# Patient Record
Sex: Female | Born: 1940
Health system: Southern US, Community
[De-identification: ages and names within clinical notes are randomized; demographics above are authoritative.]

## PROBLEM LIST (undated history)

## (undated) DIAGNOSIS — Z86010 Personal history of colonic polyps: Secondary | ICD-10-CM

## (undated) DIAGNOSIS — U071 COVID-19: Secondary | ICD-10-CM

## (undated) DIAGNOSIS — K317 Polyp of stomach and duodenum: Secondary | ICD-10-CM

## (undated) DIAGNOSIS — F419 Anxiety disorder, unspecified: Secondary | ICD-10-CM

## (undated) DIAGNOSIS — E785 Hyperlipidemia, unspecified: Secondary | ICD-10-CM

## (undated) DIAGNOSIS — I10 Essential (primary) hypertension: Secondary | ICD-10-CM

## (undated) DIAGNOSIS — M1990 Unspecified osteoarthritis, unspecified site: Secondary | ICD-10-CM

## (undated) DIAGNOSIS — K219 Gastro-esophageal reflux disease without esophagitis: Secondary | ICD-10-CM

## (undated) DIAGNOSIS — T7840XA Allergy, unspecified, initial encounter: Secondary | ICD-10-CM

## (undated) HISTORY — PX: CHOLECYSTECTOMY: SHX55

## (undated) HISTORY — DX: Unspecified osteoarthritis, unspecified site: M19.90

## (undated) HISTORY — PX: UPPER GASTROINTESTINAL ENDOSCOPY: SHX188

## (undated) HISTORY — PX: COLONOSCOPY: SHX174

## (undated) HISTORY — DX: Allergy, unspecified, initial encounter: T78.40XA

## (undated) HISTORY — DX: Personal history of colonic polyps: Z86.010

## (undated) HISTORY — DX: Polyp of stomach and duodenum: K31.7

## (undated) HISTORY — DX: Essential (primary) hypertension: I10

## (undated) HISTORY — DX: Hyperlipidemia, unspecified: E78.5

## (undated) HISTORY — PX: TOTAL ABDOMINAL HYSTERECTOMY: SHX209

## (undated) HISTORY — DX: COVID-19: U07.1

## (undated) HISTORY — DX: Gastro-esophageal reflux disease without esophagitis: K21.9

## (undated) HISTORY — DX: Anxiety disorder, unspecified: F41.9

---

## 2000-10-04 ENCOUNTER — Ambulatory Visit (HOSPITAL_COMMUNITY): Admission: RE | Admit: 2000-10-04 | Discharge: 2000-10-04 | Payer: Self-pay | Admitting: Gastroenterology

## 2004-01-07 ENCOUNTER — Ambulatory Visit: Payer: Self-pay | Admitting: Family Medicine

## 2004-01-10 ENCOUNTER — Ambulatory Visit: Payer: Self-pay | Admitting: Internal Medicine

## 2004-01-22 ENCOUNTER — Ambulatory Visit: Payer: Self-pay | Admitting: Internal Medicine

## 2004-02-06 ENCOUNTER — Ambulatory Visit: Payer: Self-pay | Admitting: Family Medicine

## 2004-04-15 ENCOUNTER — Ambulatory Visit: Payer: Self-pay | Admitting: Family Medicine

## 2004-08-04 ENCOUNTER — Ambulatory Visit: Payer: Self-pay | Admitting: Family Medicine

## 2004-10-07 ENCOUNTER — Ambulatory Visit: Payer: Self-pay | Admitting: Family Medicine

## 2005-01-14 ENCOUNTER — Ambulatory Visit: Payer: Self-pay | Admitting: Family Medicine

## 2005-05-18 ENCOUNTER — Ambulatory Visit: Payer: Self-pay | Admitting: Family Medicine

## 2005-10-07 ENCOUNTER — Ambulatory Visit: Payer: Self-pay | Admitting: Family Medicine

## 2005-10-27 ENCOUNTER — Ambulatory Visit: Payer: Self-pay | Admitting: Family Medicine

## 2005-11-02 ENCOUNTER — Ambulatory Visit: Payer: Self-pay | Admitting: Internal Medicine

## 2005-11-17 ENCOUNTER — Encounter (INDEPENDENT_AMBULATORY_CARE_PROVIDER_SITE_OTHER): Payer: Self-pay | Admitting: *Deleted

## 2005-11-17 ENCOUNTER — Ambulatory Visit: Payer: Self-pay | Admitting: Internal Medicine

## 2006-02-10 ENCOUNTER — Ambulatory Visit: Payer: Self-pay | Admitting: Family Medicine

## 2006-06-20 ENCOUNTER — Ambulatory Visit: Payer: Self-pay | Admitting: Family Medicine

## 2006-10-08 ENCOUNTER — Encounter: Admission: RE | Admit: 2006-10-08 | Discharge: 2006-10-08 | Payer: Self-pay | Admitting: Neurology

## 2008-12-13 ENCOUNTER — Encounter: Admission: RE | Admit: 2008-12-13 | Discharge: 2009-02-27 | Payer: Self-pay | Admitting: Orthopaedic Surgery

## 2009-05-25 ENCOUNTER — Inpatient Hospital Stay (HOSPITAL_COMMUNITY): Admission: EM | Admit: 2009-05-25 | Discharge: 2009-05-26 | Payer: Self-pay | Admitting: Emergency Medicine

## 2009-05-26 ENCOUNTER — Encounter (INDEPENDENT_AMBULATORY_CARE_PROVIDER_SITE_OTHER): Payer: Self-pay | Admitting: *Deleted

## 2009-05-26 ENCOUNTER — Encounter (INDEPENDENT_AMBULATORY_CARE_PROVIDER_SITE_OTHER): Payer: Self-pay | Admitting: Internal Medicine

## 2009-05-26 ENCOUNTER — Ambulatory Visit: Payer: Self-pay | Admitting: Vascular Surgery

## 2009-11-20 ENCOUNTER — Encounter: Payer: Self-pay | Admitting: Internal Medicine

## 2009-11-21 ENCOUNTER — Encounter (INDEPENDENT_AMBULATORY_CARE_PROVIDER_SITE_OTHER): Payer: Self-pay | Admitting: *Deleted

## 2010-01-12 ENCOUNTER — Ambulatory Visit: Payer: Self-pay | Admitting: Internal Medicine

## 2010-01-12 ENCOUNTER — Encounter (INDEPENDENT_AMBULATORY_CARE_PROVIDER_SITE_OTHER): Payer: Self-pay | Admitting: *Deleted

## 2010-01-12 DIAGNOSIS — K219 Gastro-esophageal reflux disease without esophagitis: Secondary | ICD-10-CM

## 2010-01-12 DIAGNOSIS — E669 Obesity, unspecified: Secondary | ICD-10-CM | POA: Insufficient documentation

## 2010-01-12 DIAGNOSIS — R1319 Other dysphagia: Secondary | ICD-10-CM

## 2010-01-13 ENCOUNTER — Ambulatory Visit: Payer: Self-pay | Admitting: Internal Medicine

## 2010-01-18 ENCOUNTER — Encounter: Payer: Self-pay | Admitting: Internal Medicine

## 2010-03-17 ENCOUNTER — Ambulatory Visit
Admission: RE | Admit: 2010-03-17 | Discharge: 2010-03-17 | Payer: Self-pay | Source: Home / Self Care | Attending: Internal Medicine | Admitting: Internal Medicine

## 2010-04-02 NOTE — Letter (Signed)
Summary: EGD Instructions  [facility]  [address] [place], [place] [address]   Phone: [phone]  Fax: [phone]       Grace Mccann    November 22, 1940    MRN: [id number]       Procedure Day Dorna Bloom: Jake Shark, 01/13/10     Arrival Time: 10:00 AM     Procedure Time: 11:00 AM    Location of Procedure:                    _X_ [facility] (4th Floor)  PREPARATION FOR ENDOSCOPY   On TUESDAY, 01/13/10, THE DAY OF THE PROCEDURE:  1.   No solid foods, milk or milk products are allowed after midnight the night before your procedure.  2.   Do not drink anything colored red or purple.  Avoid juices with pulp.  No orange juice.  3.  You may drink clear liquids until 9:00 AM, which is 2 hours before your procedure.                                                                                                CLEAR LIQUIDS INCLUDE: Water Jello Ice Popsicles Tea (sugar ok, no milk/cream) Powdered fruit flavored drinks Coffee (sugar ok, no milk/cream) Gatorade Juice: apple, white grape, white cranberry  Lemonade Clear bullion, consomm, broth Carbonated beverages (any kind) Strained chicken noodle soup Hard Candy   MEDICATION INSTRUCTIONS  Unless otherwise instructed, you should take regular prescription medications with a small sip of water as early as possible the morning of your procedure.                    OTHER INSTRUCTIONS  You will need a responsible adult at least 69 years of age to accompany you and drive you home.   This person must remain in the waiting room during your procedure.  Wear loose fitting clothing that is easily removed.  Leave jewelry and other valuables at home.  However, you may wish to bring a book to read or an iPod/MP3 player to listen to music as you wait for your procedure to start.  Remove all body piercing jewelry and leave at home.  Total time from sign-in until discharge is approximately 2-3  hours.  You should go home directly after your procedure and rest.  You can resume normal activities the day after your procedure.  The day of your procedure you should not:   Drive   Make legal decisions   Operate machinery   Drink alcohol   Return to work  You will receive specific instructions about eating, activities and medications before you leave.    The above instructions have been reviewed and explained to me by   _______________________    I fully understand and can verbalize these instructions _____________________________ Date _________

## 2010-04-02 NOTE — Letter (Signed)
Summary: New Patient letter  [facility]  [address] [place], [place] [address]   Phone: [phone]  Fax: [phone]       11/21/2009 MRN: [id number]  Grace Mccann [address] [place], [place]  [address]  Dear Grace Mccann,  Welcome to the Gastroenterology Division at [facility].    You are scheduled to see Dr. Leone Payor on 01/12/2010 at 1:45PM on the 3rd floor at [facility], [address]  We ask that you try to arrive at our office 15 minutes prior to your appointment time to allow for check-in.  We would like you to complete the enclosed self-administered evaluation form prior to your visit and bring it with you on the day of your appointment.  We will review it with you.  Also, please bring a complete list of all your medications or, if you prefer, bring the medication bottles and we will list them.  Please bring your insurance card so that we may make a copy of it.  If your insurance requires a referral to see a specialist, please bring your referral form from your primary care physician.  Co-payments are due at the time of your visit and may be paid by cash, check or credit card.     Your office visit will consist of a consult with your physician (includes a physical exam), any laboratory testing he/she may order, scheduling of any necessary diagnostic testing (e.g. x-ray, ultrasound, CT-scan), and scheduling of a procedure (e.g. Endoscopy, Colonoscopy) if required.  Please allow enough time on your schedule to allow for any/all of these possibilities.    If you cannot keep your appointment, please call [phone] to cancel or reschedule prior to your appointment date.  This allows [place] the opportunity to schedule an appointment for another patient in need of care.  If you do not cancel or reschedule by 5 p.m. the business day prior to your appointment date, you will be charged a $50.00 late cancellation/no-show fee.    Thank you for choosing [facility] for your medical needs.  We appreciate the opportunity to care for you.  Please visit [place] at our website  to learn more about our practice.                     Sincerely,                                                             The Gastroenterology Division

## 2010-04-02 NOTE — Procedures (Signed)
Summary: EGD:    EGD  Procedure date:  11/17/2005  Findings:      Comments: 1) DISTAL ESOPHAGEAL RINGS (DILATED TO 54 FR) 2) POLYPOID LESIONS IN CARDIA OF UNCLEAR SIGNIFICANCE, BIOPSY TAKEN 3) OTHERWISE OK  ***MICROSCOPIC EXAMINATION AND DIAGNOSIS***    STOMACH: CHRONIC GASTRITIS. NO HELICOBACTER PYLORI, INTESTINAL METAPLASIA, OR EVIDENCE OF MALIGNANCY IDENTIFIED.  Patient Name: Grace Mccann, Grace Mccann MRN: [id number] Procedure Procedures: Panendoscopy (EGD) CPT: 43235.    with biopsy(s)/brushing(s). CPT: D1846139.    with [facility] Dilation of Esophagus Personnel: Endoscopist: Iva Boop, MD, [facility].  Referred By: Joette Catching, MD.  Exam Location: Exam performed in Outpatient Clinic. Outpatient  Patient Consent: Procedure, Alternatives, Risks and Benefits discussed, consent obtained, from patient. Consent was obtained by the RN.  Indications  Therapeutics: Reason for exam: Esophageal dilation.  Symptoms: Dysphagia.  History  Current Medications: Patient is not currently taking Coumadin.  Allergies: No known allergies.  Pre-Exam Physical: Performed Nov 17, 2005  Cardio-pulmonary exam, HEENT exam, Abdominal exam, Mental status exam WNL.  Comments: Pt. history reviewed/updated, physical exam performed prior to initiation of sedation? yes Exam Exam Info: Maximum depth of insertion Duodenum, intended Duodenum. Patient position: on left side. Gastric retroflexion performed. Images taken. ASA Classification: II. Tolerance: excellent.  Sedation Meds: Patient assessed and found to be appropriate for moderate (conscious) sedation. Fentanyl 50 mcg. given IV. Versed 5 mg. given IV. Cetacaine Spray 2 sprays given aerosolized.  Monitoring: BP and pulse monitoring done. Oximetry used. Supplemental O2 given  Findings - Normal: Proximal Esophagus to Mid Esophagus.  STRICTURE / STENOSIS: Stricture in Distal Esophagus.  Constriction: partial. Comment: two rings in distal esophagus,  above z-line (which is at 37 cm).  - Dilation: Distal Esophagus. Maloney dilator used, Diameter: 54 F, Minimal Resistance, No Heme present on extraction. 1  total dilators used. Patient tolerance excellent.  - Normal: Fundus to Duodenal 2nd Portion.  POLYP: in Cardia. Maximum size: 6 mm. sessile polyp. Procedure: biopsy without cautery, Comments: 3 polypoid lesions in cardia.   Assessment  Comments: 1) DISTAL ESOPHAGEAL RINGS (DILATED TO 54 FR) 2) POLYPOID LESIONS IN CARDIA OF UNCLEAR SIGNIFICANCE, BIOPSY TAKEN 3) OTHERWISE OK Events  Unplanned Intervention: No unplanned interventions were required.  Plans Instructions: Clear or full liquids: clears until 1530 then soft. Resume previous diet: tomorrow.  Medication(s): Continue current medications.  Patient Education: Patient given standard instructions for: Stenosis / Stricture.  Disposition: After procedure patient sent to recovery. After recovery patient sent home.  Scheduling: Path Letter, to The Patient, re: results   Comments: Call for visit if dysphagia (abnormal swallowing) persists or returns  CC:   Joette Catching, MD  This report was created from the original endoscopy report, which was reviewed and signed by the above listed endoscopist.

## 2010-04-02 NOTE — Assessment & Plan Note (Signed)
Summary: 70M FU AFTER ENDO/YF   History of Present Illness Visit Type: Follow-up Visit Primary GI MD: Stan Head MD [facility] Primary Provider: Joette Catching, MD Requesting Provider: n/a Chief Complaint: Patient here for a 1 month f/u after endoscopy. She denies any current GI symptoms. History of Present Illness:   She is not having dysphagia at this time. She tried lansoprazle but for some reason went back to omeprazole 40 mg daily. She began Align at the direction of Dr. Lysbeth Galas and feels that has helped her significantly as well.    GI Review of Systems      Denies abdominal pain, acid reflux, belching, bloating, chest pain, dysphagia with liquids, dysphagia with solids, heartburn, loss of appetite, nausea, vomiting, vomiting blood, weight loss, and  weight gain.        Denies anal fissure, black tarry stools, change in bowel habit, constipation, diarrhea, diverticulosis, fecal incontinence, heme positive stool, hemorrhoids, irritable bowel syndrome, jaundice, light color stool, liver problems, rectal bleeding, and  rectal pain. Preventive Screening-Counseling & Management  Caffeine-Diet-Exercise     Does Patient Exercise: no    Current Medications (verified): 1)  Mobic 7.5 Mg Tabs (Meloxicam) .... Take 1 Tablet By Mouth Once A Day 2)  Premarin 0.3 Mg Tabs (Estrogens Conjugated) .... Take 1 Tablet By Mouth Once A Day 3)  Livalo 4 Mg Tabs (Pitavastatin Calcium) .... Take 1 Tablet By Mouth On Monday, Wednesday, and Friday 4)  Exforge 10-160 Mg Tabs (Amlodipine Besylate-Valsartan) .... Take 1 Tablet By Mouth Once A Day 5)  Lorazepam 0.5 Mg Tabs (Lorazepam) .Marland Kitchen.. 1 By Mouth Once Daily As Needed 6)  Aspirin 81 Mg Tbec (Aspirin) .Marland Kitchen.. 1 By Mouth Once Daily 7)  Omeprazole 40 Mg Cpdr (Omeprazole) .... Take 1 Tablet By Mouth Once A Day 8)  Flonase 50 Mcg/act Susp (Fluticasone Propionate) .... 2 Spays in Each Nostril Daily 9)  Align  Caps (Probiotic Product) .... Take 1 Capsule By Mouth  Once Daily  Allergies (verified): 1)  Niacin  Past History:  Past Medical History: Arthritis GERD/esophageal ring Hyperlipidemia Hypertension Hx of gallstones 70 Gastric polyps  Past Surgical History: Hysterectomy  70 Cholecystectomy  Family History: Family History of Breast Cancer:Mother No FH of Colon Cancer: Family History of Heart Disease: Father  Social History: Occupation: retired Married 3 children Patient has never smoked.  Illicit Drug Use - no Alcohol Use - no Patient does not get regular exercise.  Does Patient Exercise:  no  Vital Signs:  Patient profile:   70 year old female Height:      66 inches Weight:      208.38 pounds BMI:     33.75 BSA:     2.04 Pulse rate:   84 / minute Pulse rhythm:   regular BP sitting:   112 / 72  (left arm)  Vitals Entered By: Lamona Curl CMA Duncan Dull) (March 17, 2010 1:33 PM)  Physical Exam  General:  obese.  NAD   Impression & Recommendations:  Problem # 1:  OTHER DYSPHAGIA (ICD-787.29) Assessment Improved She responded to dilation with [facility] dilator.  Problem # 2:  GERD (ICD-530.81) Assessment: Improved Doing well on omperazole and will continue. Refills via PCP and see me as needed for this.  Problem # 3:  GASTRIC POLYPS (ICD-211.1) Assessment: New multiple hyperplastic polyps - typically not pre-cancerous but rarely so will perform surveillance EGD 11/12 (at least one to check for any changes or progression - removal of larger ones possibly)  Patient Instructions:  1)  Copy sent to : Joette Catching, MD 2)  Continue your current medications and seeing your PCP as needed. 3)  Please schedule a follow-up appointment as needed.  4)  The medication list was reviewed and reconciled.  All changed / newly prescribed medications were explained.  A complete medication list was provided to the patient / caregiver.

## 2010-04-02 NOTE — Letter (Signed)
[facility]   Imported By: Lester  01/19/2010 08:23:35  _____________________________________________________________________  External Attachment:    Type:   Image     Comment:   External Document

## 2010-04-02 NOTE — Miscellaneous (Signed)
  Clinical Lists Changes  Medications: Removed medication of PREVACID 15 MG CPDR (LANSOPRAZOLE) 1 by mouth once daily Changed medication from OMEPRAZOLE 40 MG CPDR (OMEPRAZOLE) 1 by mouth once daily to LANSOPRAZOLE 30 MG CPDR (LANSOPRAZOLE) 1 by mouth twice a day 30-60 minutes before breakfast and supper - Signed Rx of LANSOPRAZOLE 30 MG CPDR (LANSOPRAZOLE) 1 by mouth twice a day 30-60 minutes before breakfast and supper;  #60 x 2;  Signed;  Entered by: Iva Boop MD, Clementeen Graham;  Authorized by: Iva Boop MD, [facility];  Method used: Electronically to [facility] [address], [place], [place]  [address], Ph: [phone], Fax:    Prescriptions: LANSOPRAZOLE 30 MG CPDR (LANSOPRAZOLE) 1 by mouth twice a day 30-60 minutes before breakfast and supper  #60 x 2   Entered and Authorized by:   Iva Boop MD, [facility]   Signed by:   Iva Boop MD, [facility] on 01/13/2010   Method used:   Electronically to        [facility]* (retail)       [address]       [place], [place]  [address]       Ph: [phone]       Fax:    RxID:   [id number]

## 2010-04-02 NOTE — Discharge Summary (Signed)
Summary: Hospital Discharge Summary   DATE OF ADMISSION:  05/25/2009   DATE OF DISCHARGE:  05/26/2009                                  DISCHARGE SUMMARY      PRIMARY CARE PHYSICIAN:  Dr. Ralene Ok. Nyland.      DISCHARGE DIAGNOSES:   1. Syncope.  Complete evaluation unrevealing.   2. Hypertension.   3. Gastroesophageal reflux disease.   4. Arthritis.   5. Hyperlipidemia.   6. Diuretic associated hypokalemia, resolved.   7. Allergy to niacin.      DISCHARGE MEDICATIONS:  A complete list of the patient's discharge   medications is available as per the discharge medication manager portion   of the [facility] E-Chart computer system.  In short, the   patient is being discharged on all the same medications she was using   prior to her admission with the exception of Diovan/HCT which she has   been instructed to hold until further evaluation by her primary care   physician.      FOLLOWUP:  The patient is advised to refrain from driving for 2 weeks   because of the possibility for recurrent syncope.  She is advised to   follow up with her primary care physician within 5-7 days for   reevaluation.  At that time, a B-Met should be obtained to ensure that   the patient's potassium remains stable.  It was 3.6 at discharge.  The   patient's blood pressure should be assessed, and consideration should be   given to resuming Diovan with or without the hydrochlorothiazide   component.      CONSULTATIONS:  None.      PROCEDURES:   1. CT scan of the head May 25, 2009 was negative.   2. CT angiogram of the chest May 25, 2009 with atelectasis in both       lower lobes.  No embolus or dissection identified.   3. Bilateral carotid Dopplers May 26, 2009.  Right 40%-59% ICA       stenosis, within the low range.  No evidence of left ICA stenosis.       Bilateral vertebral artery flow was antegrade.   4. Right lower extremity venous Doppler May 18, 2009 with no DVT.   5.  Transthoracic echocardiogram May 26, 2009 shows LV cavity size       normal.  Ejection fraction was 55%-60%.  Systolic function normal.       No regional wall motion abnormalities.      HOSPITAL COURSE:  Ms. Grace Mccann is a very pleasant 70 year old   female who was admitted to the hospital on May 25, 2009 after she   suffered a simple syncopal spell at a family member's house.  She had   just eaten lunch and began to develop abdominal cramps.  She then began   to experience some nausea.  She went into the bathroom.  She apparently   sat on the toilet and attempted to defecate.  She then apparently fell   over onto the floor and was found by family.  Family reports the patient   was initially unresponsive and appeared to be clenching her jaw.  There   was some concern that she was experiencing difficulty breathing.  There   was no tonic clonic type behavior.  The patient rapidly aroused.  EMS  was consulted.  The patient was brought to the emergency room and   ultimately admitted to the acute unit.  She was monitored on telemetry   with no evidence of arrhythmia.  EKGs were unrevealing, and she ruled   out for acute coronary syndrome.  Because of a very borderline elevated   D-dimer, a CT angiogram of the chest was carried out.  This was   unrevealing with no evidence of pulmonary embolism or other lung   abnormalities.  Carotid Dopplers were accomplished and were   insignificant.  Transthoracic echocardiogram was accomplished and was   unremarkable.  Lower extremity DVT was ruled out with a venous Doppler   that was negative.  Laboratory evaluation revealed only a mild   hypokalemia.  This was felt to be secondary to hydrochlorothiazide use   at home.  This was quickly replaced with oral potassium.  The patient   was mildly dehydrated.  This was corrected with IV fluid resuscitation.   At this time, it is felt that the patient likely suffered a syncopal   spell due either to  Valsalva or simply a vasovagal event related to her   abdominal cramping and nausea.  The patient has been advised that   syncopal spells could recur.  She has been advised that she should   refrain from driving for 2 weeks until it can be proven that she will   not have frequent syncopal spells.  She is advised to hold her   Diovan/HCT until follow-up with her primary care physician.  She is   advised to follow up within 5-7 days for reassessment of her blood   pressure and potassium level.               Lonia Blood, M.D.            JTM/MEDQ  D:  05/26/2009  T:  05/26/2009  Job:  [id number]      cc:   Delaney Meigs, M.D.      Electronically Signed by Jetty Duhamel M.D. on 06/18/2009 07:24:13 PM

## 2010-04-02 NOTE — Assessment & Plan Note (Signed)
Summary: DYSPHAGIA...AS.   History of Present Illness Visit Type: Initial Consult Primary GI MD: Stan Head MD [facility] Primary Provider: Fanny Skates Requesting Provider: Fanny Skates Chief Complaint: ? Dyspahgia, Pt states she is not sure if its Dyspagia or Sinuses History of Present Illness:   70 yo ww with globus sensation and tightness in neck. She swallows and it still feels like something is there. Most of the time food passes but with certain foods its hard to go down (salmon, chicken, bread, crowder peas, rice). These problems have been occurring for a few months at least. she had a dilation for dysphagia in 2007 with benefit. She had been on omeprazole 40 mg a day for 6-9 months, started after Nexium stopped being efficacious - "I still had that in my throat". recentky added lansoprazole 15 mg daily and is taking with the omeprazole at 10-11 AM. She does have sinus drainage issues also.   GI Review of Systems    Reports acid reflux, dysphagia with solids, heartburn, and  nausea.      Denies abdominal pain, belching, bloating, chest pain, dysphagia with liquids, loss of appetite, vomiting, vomiting blood, weight loss, and  weight gain.        Denies anal fissure, black tarry stools, change in bowel habit, constipation, diarrhea, diverticulosis, fecal incontinence, heme positive stool, hemorrhoids, irritable bowel syndrome, jaundice, light color stool, liver problems, rectal bleeding, and  rectal pain. Preventive Screening-Counseling & Management  Alcohol-Tobacco     Smoking Status: never      Drug Use:  no.      Clinical Reports Reviewed:  EGD:  11/17/2005:  Comments: 1) DISTAL ESOPHAGEAL RINGS (DILATED TO 54 FR) 2) POLYPOID LESIONS IN CARDIA OF UNCLEAR SIGNIFICANCE, BIOPSY TAKEN 3) OTHERWISE OK  ***MICROSCOPIC EXAMINATION AND DIAGNOSIS***    STOMACH: CHRONIC GASTRITIS. NO HELICOBACTER PYLORI, INTESTINAL METAPLASIA, OR EVIDENCE OF MALIGNANCY  IDENTIFIED.  Colonoscopy:  01/22/2004:  Assessment Normal examination.  Comments: NO POLYPS OR CANCER SEEN   Current Medications (verified): 1)  Aspirin 81 Mg Tabs (Aspirin) .... Take 1 Tablet By Mouth Once A Day 2)  Mobic 7.5 Mg Tabs (Meloxicam) .... Take 1 Tablet By Mouth Once A Day 3)  Premarin 0.3 Mg Tabs (Estrogens Conjugated) .... Take 1 Tablet By Mouth Once A Day 4)  Livalo 4 Mg Tabs (Pitavastatin Calcium) .... Take 1/2 Tab By Mouth On Monday, Wednesday, and Friday 5)  Exforge 10-160 Mg Tabs (Amlodipine Besylate-Valsartan) 6)  Lorazepam 0.5 Mg Tabs (Lorazepam) .Marland Kitchen.. 1 By Mouth Once Daily As Needed 7)  Aspirin 81 Mg Tbec (Aspirin) .Marland Kitchen.. 1 By Mouth Once Daily 8)  Omeprazole 40 Mg Cpdr (Omeprazole) .Marland Kitchen.. 1 By Mouth Once Daily 9)  Flonase 50 Mcg/act Susp (Fluticasone Propionate) .... 2 Spays in Each Nostril Daily 10)  Prevacid 15 Mg Cpdr (Lansoprazole) .Marland Kitchen.. 1 By Mouth Once Daily  Allergies (verified): 1)  Niacin  Past History:  Past Medical History: Arthritis GERD/esophageal ring Hyperlipidemia Hypertension Hx of gallstones 89  Past Surgical History: Hysterectomy  58  Family History: Family History of Breast Cancer:Mother No FH of Colon Cancer:  Social History: Occupation: retired Married 3 children Patient has never smoked.  Illicit Drug Use - no Alcohol Use - no Smoking Status:  never Drug Use:  no  Review of Systems       The patient complains of allergy/sinus.         All other ROS negative except as per HPI.   Vital Signs:  Patient profile:  70 year old female Height:      66 inches Weight:      211 pounds BMI:     34.18 BSA:     2.05 Pulse rate:   80 / minute BP sitting:   110 / 80  (left arm)  Vitals Entered By: Merri Ray CMA Duncan Dull) (January 12, 2010 1:36 PM)  Physical Exam  General:  obese.  NAD Eyes:  PERRLA, no icterus. Mouth:  No deformity or lesions, dentition normal. Neck:  Supple; no masses or thyromegaly. Lungs:  Clear  throughout to auscultation. Heart:  Regular rate and rhythm; no murmurs, rubs,  or bruits. Abdomen:  Soft, nontender and nondistended. No masses, hepatosplenomegaly or hernias noted. Normal bowel sounds. Cervical Nodes:  No significant cervical or supraclavicular adenopathy.  Psych:  Alert and cooperative. Normal mood and affect.   Impression & Recommendations:  Problem # 1:  OTHER DYSPHAGIA (ICD-787.29) Assessment Deteriorated  Last evaluated 2007 Ring dilated then also with globus which could be GERD and/or sinus EGD/dili Risks, benefits,and indications of endoscopic procedure(s) were reviewed with the patient and all questions answered.  change to lansoprazole 30 mg daily (Nexium stoped working, omeprazole was not.) wait for EGD to Rx (having tomorrow)  Orders: EGD SAV (EGD SAV)  Problem # 2:  GERD (ICD-530.81) Assessment: Deteriorated  Last evaluated 2007 Ring dilated then also with globus which could be GERD and/or sinus EGD/dili Risks, benefits,and indications of endoscopic procedure(s) were reviewed with the patient and all questions answered.  change to lansoprazole 30 mg daily (Nexium stoped working, omeprazole was not.) wait for EGD to Rx (having tomorrow)  Orders: EGD SAV (EGD SAV)  Problem # 3:  OBESITY (ICD-278.00) Assessment: New She is losing weight, she says. Encouraged to continue to do so as will help GERD and overall health.  Patient Instructions: 1)  You need to keep trying to  lose weight. Start by limiting portions, amounts. Avoid eating when not hungry. Limit desserts.Look for high fructose corn syrup on food labels and if in first 3 ingredients, avoid that food. Also try to eat whole grains, avoid "white foods" (e.g. white rice, white bread).  Once your esophagus is working better. 2)  [facility] Patient Information Guide given to patient.  3)  Upper Endoscopy with Dilatation brochure given.  4)  Copy sent to : Joette Catching, MD 5)   The medication list was reviewed and reconciled.  All changed / newly prescribed medications were explained.  A complete medication list was provided to the patient / caregiver.

## 2010-04-02 NOTE — Procedures (Signed)
Summary: Colonoscopy:    Colonoscopy  Procedure date:  01/22/2004  Findings:      Assessment Normal examination.  Comments: NO POLYPS OR CANCER SEEN  Procedures Next Due Date:    Colonoscopy: 01/2014  Patient Name: Grace Mccann, Grace Mccann MRN: [id number] Procedure Procedures: Colonoscopy CPT: 19147.  Personnel: Endoscopist: Iva Boop, MD, [facility].  Referred By: Joette Catching, MD.  Exam Location: Exam performed in Outpatient Clinic. Outpatient  Patient Consent: Procedure, Alternatives, Risks and Benefits discussed, consent obtained, from patient. Consent was obtained by the RN.  Indications  Average Risk Screening Routine.  History  Current Medications: Patient is not currently taking Coumadin.  Pre-Exam Physical: Cardio-pulmonary exam, Rectal exam, HEENT exam , Mental status exam WNL. Abnormal PE findings include: ASA AIRWAY III.  Exam Exam: Extent of exam reached: Cecum, extent intended: Cecum.  The cecum was identified by appendiceal orifice and IC valve. Patient position: on left side. Colon retroflexion performed. Images taken. ASA Classification: II. Tolerance: good.  Monitoring: Pulse and BP monitoring, Oximetry used. Supplemental O2 given.  Colon Prep Used MiraLax for colon prep. Prep results: excellent.  Sedation Meds: Patient assessed and found to be appropriate for moderate (conscious) sedation. Fentanyl 75 mcg. given IV. Versed 7 mg. given IV.  Findings - NORMAL EXAM: Cecum to Rectum.   Assessment Normal examination.  Comments: NO POLYPS OR CANCER SEEN Events  Unplanned Interventions: No intervention was required.  Plans Patient Education: Patient given standard instructions for: a normal exam. Yearly hemoccult testing recommended. START IN 5 YRS.  Disposition: After procedure patient sent to recovery. After recovery patient sent home.  Scheduling/Referral: Primary Care Tonja Jezewski, to Joette Catching, MD, AS PLANNED,  Colonoscopy, to Iva Boop, MD, [facility], [address],   CC:   Joette Catching, MD  This report was created from the original endoscopy report, which was reviewed and signed by the above listed endoscopist.

## 2010-04-02 NOTE — Procedures (Signed)
Summary: Upper Endoscopy  Patient: [name] Note: All result statuses are Final unless otherwise noted.  Tests: (1) Upper Endoscopy (EGD)   EGD Upper Endoscopy       DONE     [facility]     [address]     [place], [place]  [address]           ENDOSCOPY PROCEDURE REPORT           PATIENT:  Grace Mccann, Grace Mccann  MR#:  [id number]     BIRTHDATE:  1941-02-03, 69 yrs. old  GENDER:  female           ENDOSCOPIST:  Iva Boop, MD, [facility]           PROCEDURE DATE:  01/13/2010     PROCEDURE:  EGD with biopsy, 43239, Elease Hashimoto Dilation of Esophagus     ASA CLASS:  Class II     INDICATIONS:  dysphagia, reflux symptoms despite therapy           MEDICATIONS:   Fentanyl 50 mcg IV, Versed 5 mg IV     TOPICAL ANESTHETIC:  Exactacain Spray           DESCRIPTION OF PROCEDURE:   After the risks benefits and     alternatives of the procedure were thoroughly explained, informed     consent was obtained.  The LB GIF-H180 [id number] endoscope was     introduced through the mouth and advanced to the second portion of     the duodenum, without limitations.  The instrument was slowly     withdrawn as the mucosa was fully examined.     <<PROCEDUREIMAGES>>           There were multiple polyps identified. in the body of the stomach.     Seen in fundus and cardia also. Numerous with maximum size 8-10     mm. representative biopsies taken. Most were fleshy and with     similar appearance as mucosa, some were erythematous (cardia).     Multiple biopsies were obtained and sent to pathology.  Otherwise     the examination was normal. Maloney dilation 59 French performed     after scope withdrawn.     Retroflexed views revealed Retroflexion     exam demonstrated findings as previously described.    The scope     was then withdrawn from the patient and the procedure completed.           COMPLICATIONS:  None           ENDOSCOPIC IMPRESSION:     1) Polyps, multiple in the body of the stomach -  biopsied     2) Otherwise normal examination     RECOMMENDATIONS:     1) Stop omeprazole and Prevacid     2) Start lansoprazole 30 mg 30-60 minutes before supper and     breakfast twice a day.     3) Await biopsies - will notify     4) Call Dr. Marvell Fuller office this week to make an appointment     for 2 months from now to see if swallowing problems and sinus     drainage are better.           REPEAT EXAM:  In for as needed.           Iva Boop, MD, [facility]           CC:  Joette Catching, MD, The  Patient           n.     Rosalie Doctor:   Iva Boop at 01/13/2010 11:53 AM           [name], [phone]  Note: An exclamation mark (!) indicates a result that was not dispersed into the flowsheet. Document Creation Date: 01/13/2010 11:54 AM _______________________________________________________________________  (1) Order result status: Final Collection or observation date-time: 01/13/2010 11:27 Requested date-time:  Receipt date-time:  Reported date-time:  Referring Physician:   Ordering Physician: Stan Head [phone]) Specimen Source:  Source: Launa Grill Order Number: [id number] Lab site:   Appended Document: Upper Endoscopy   EGD  Procedure date:  01/13/2010  Findings:          1) Polyps, multiple in the body of the stomach - biopsied HYPERPLASTIC     2) Otherwise normal examination  Procedures Next Due Date:    EGD: 12/2010   Appended Document: Upper Endoscopy     Procedures Next Due Date:    EGD: 12/2010

## 2010-04-02 NOTE — Letter (Signed)
Summary: Patient [facility] Biopsy Results  [facility]  [address] [place], [place] [address]   Phone: [phone]  Fax: [phone]        January 18, 2010 MRN: [id number]    Grace Mccann [address]  [address], [place]  [address]    Dear [name],  The biopsies taken during your recent endoscopic examination indicated that you have hyperplastic stomach polyps. these usually do not cause significant problems in patients.  Please continue with the treatment plan and follow-up visit as outlined on the day of your exam.  You should have a repeat endoscopic examination for this problem              in 1 year  so these polyps can be rechecked to make sure they are not changing or causing problems.  Please call us if you are having persistent problems or have questions about your condition that have not been fully answered at this time.  Sincerely,  Iva Boop MD, [facility]  This letter has been electronically signed by your physician.  Appended Document: Patient Notice-Endo Biopsy Results Letter mailed

## 2010-05-25 LAB — COMPREHENSIVE METABOLIC PANEL
Alkaline Phosphatase: 49 U/L (ref 39–117)
BUN: 16 mg/dL (ref 6–23)
Creatinine, Ser: 1.11 mg/dL (ref 0.4–1.2)
GFR calc Af Amer: 59 mL/min — ABNORMAL LOW (ref >60)
Potassium: 2.9 mEq/L — ABNORMAL LOW (ref 3.5–5.1)
Sodium: 140 mEq/L (ref 135–145)

## 2010-05-25 LAB — DIFFERENTIAL
Basophils Relative: 0 % (ref 0–1)
Eosinophils Absolute: 0 10*3/uL (ref 0.0–0.7)
Eosinophils Relative: 0 % (ref 0–5)
Lymphocytes Relative: 9 % — ABNORMAL LOW (ref 12–46)
Lymphs Abs: 1.5 10*3/uL (ref 0.7–4.0)
Monocytes Absolute: 0.3 10*3/uL (ref 0.1–1.0)
Neutro Abs: 14.3 10*3/uL — ABNORMAL HIGH (ref 1.7–7.7)

## 2010-05-25 LAB — POCT CARDIAC MARKERS
CKMB, poc: 4.4 ng/mL (ref 1.0–8.0)
Myoglobin, poc: 341 ng/mL (ref 12–200)
Myoglobin, poc: 490 ng/mL (ref 12–200)

## 2010-05-25 LAB — POCT I-STAT, CHEM 8
BUN: 18 mg/dL (ref 6–23)
Calcium, Ion: 0.96 mmol/L — ABNORMAL LOW (ref 1.12–1.32)
Creatinine, Ser: 1.1 mg/dL (ref 0.4–1.2)
Glucose, Bld: 136 mg/dL — ABNORMAL HIGH (ref 70–99)
Hemoglobin: 16 g/dL — ABNORMAL HIGH (ref 12.0–15.0)
Potassium: 3.1 mEq/L — ABNORMAL LOW (ref 3.5–5.1)
Sodium: 139 mEq/L (ref 135–145)

## 2010-05-25 LAB — BASIC METABOLIC PANEL
CO2: 26 mEq/L (ref 19–32)
Chloride: 104 mEq/L (ref 96–112)
Creatinine, Ser: 1.04 mg/dL (ref 0.4–1.2)
GFR calc Af Amer: >60 mL/min (ref >60)
Potassium: 3.6 mEq/L (ref 3.5–5.1)

## 2010-05-25 LAB — CBC
Hemoglobin: 12.5 g/dL (ref 12.0–15.0)
Platelets: 234 10*3/uL (ref 150–400)
Platelets: 249 10*3/uL (ref 150–400)

## 2010-05-25 LAB — LIPID PANEL
Cholesterol: 174 mg/dL (ref 0–200)
HDL: 50 mg/dL (ref >39)
LDL Cholesterol: 89 mg/dL (ref 0–99)
Total CHOL/HDL Ratio: 3.5 RATIO
Triglycerides: 177 mg/dL — ABNORMAL HIGH (ref <150)

## 2010-05-25 LAB — CARDIAC PANEL(CRET KIN+CKTOT+MB+TROPI)
Total CK: 292 U/L — ABNORMAL HIGH (ref 7–177)
Troponin I: 0.04 ng/mL (ref 0.00–0.06)

## 2010-05-25 LAB — CK TOTAL AND CKMB (NOT AT ARMC)
CK, MB: 3.8 ng/mL (ref 0.3–4.0)
Relative Index: 1.7 (ref 0.0–2.5)
Total CK: 228 U/L — ABNORMAL HIGH (ref 7–177)

## 2010-07-17 NOTE — Assessment & Plan Note (Signed)
[facility]                           GASTROENTEROLOGY OFFICE NOTE   Grace Mccann, Grace Mccann                        MRN:          [id number]  DATE:11/02/2005                            DOB:          Apr 20, 1940    REQUESTING PHYSICIAN:  Delaney Meigs, M.D.   REASON FOR CONSULTATION:  Dysphagia.   ASSESSMENT:  1. Dysphagia with a history of benign esophageal stricture in 2002.  This      was dilated with a Maloney dilator with a good results.  2. There is some rare heartburn and she does take Nexium.   RECOMMENDATIONS AND PLAN:  1. Upper GI endoscopy with esophageal dilation in the near future.  2. In the meantime, chew carefully to try to avoid any type of food      impaction which she has not really had.  3. Continue other medications, though we will hold aspirin and Meloxicam      prior to the procedure.   HISTORY:  A 70 year old white woman with a history as above.  Intermittent  solid dysphagia with things like chicken or rice or bread.  It is not as bad  as it was in 2002.  At that time, she saw Dr. Arlyce Dice and had an upper  endoscopy with [facility] dilation due to a benign stricture.  The exam was  otherwise unremarkable.  She did well for a long time taking her Nexium but  over the past few months she has had recurrent dysphagia.  I have reviewed  the office notes kindly sent by Dr. Lysbeth Galas.  In the past, she had a  colonoscopy in 2005, this was normal, I performed that.  General review of  systems otherwise negative.   MEDICATIONS:  1. Nexium 40 mg daily.  2. Meloxicam 7.5 mg, 1-2 a day.  3. Lipitor 20 mg daily.  4. Amlodipine 10 mg daily.  5. Aspirin 81 mg daily.  6. Premarin 0.3 mg daily.  7. Diovan HCT 160/25 mg daily.   DRUG ALLERGIES:  NONE KNOWN.   PAST MEDICAL HISTORY:  1. Hypertension.  2. Esophageal strictures.  3. Dyslipidemia.  4. Prior cholecystectomy.  5. Prior hysterectomy.  6. Prior upper GI endoscopy and  dilation.   FAMILY HISTORY:  Mother had breast cancer.  No colon cancer or GI history  reported.   SOCIAL HISTORY:  She is married.  She is a retired Surveyor, mining.  Three  sons.  Lives with her husband.  No alcohol, tobacco, or drugs.   REVIEW OF SYSTEMS:  New eyeglasses.  She has a lot of foot pain due to bone  spurs and what sounds like plantar fascitis.  Remainder of review of systems  negative.   PHYSICAL EXAMINATION:  GENERAL:  Reveals a pleasant, well developed, white  woman in no acute distress.  EYES:  Anicteric.  ENT:  Normal mouth, posterior pharynx.  NECK:  Supple.  No thyromegaly or mass.  LUNGS:  Clear.  HEART:  S1 S2.  No rubs, murmurs, or gallops.  EXTREMITIES:  There is no peripheral edema in the lower extremities.  ABDOMEN:  Soft, nontender, without organomegaly or mass or hernia.  LYMPHATIC:  No supraclavicular or cervical nodes.  SKIN:  Free of rash.  PSYCHIATRIC:  She is alert and oriented x3.   DATA REVIEW:  Include Dr. Deitra Mayo' records and previous endoscopy reports.   I appreciate the opportunity to care for this patient.                                   Iva Boop, MD,FACG   CEG/MedQ  DD:  11/02/2005  DT:  11/02/2005  Job #:  [id number]   cc:   Delaney Meigs, M.D.

## 2011-03-03 ENCOUNTER — Encounter: Payer: Self-pay | Admitting: Internal Medicine

## 2011-03-04 ENCOUNTER — Encounter: Payer: Self-pay | Admitting: Internal Medicine

## 2011-04-09 ENCOUNTER — Ambulatory Visit (AMBULATORY_SURGERY_CENTER): Payer: Medicare Other | Admitting: *Deleted

## 2011-04-09 VITALS — Ht 66.0 in | Wt 213.0 lb

## 2011-04-09 DIAGNOSIS — D131 Benign neoplasm of stomach: Secondary | ICD-10-CM

## 2011-04-23 ENCOUNTER — Ambulatory Visit (AMBULATORY_SURGERY_CENTER): Payer: Medicare Other | Admitting: Internal Medicine

## 2011-04-23 ENCOUNTER — Encounter: Payer: Self-pay | Admitting: Internal Medicine

## 2011-04-23 VITALS — BP 169/81 | HR 81 | Temp 96.9°F | Resp 16 | Ht 66.0 in | Wt 213.0 lb

## 2011-04-23 DIAGNOSIS — K317 Polyp of stomach and duodenum: Secondary | ICD-10-CM

## 2011-04-23 DIAGNOSIS — K2970 Gastritis, unspecified, without bleeding: Secondary | ICD-10-CM

## 2011-04-23 DIAGNOSIS — K2990 Gastroduodenitis, unspecified, without bleeding: Secondary | ICD-10-CM

## 2011-04-23 DIAGNOSIS — D131 Benign neoplasm of stomach: Secondary | ICD-10-CM

## 2011-04-23 MED ORDER — SODIUM CHLORIDE 0.9 % IV SOLN: 500.0000 mL | INTRAVENOUS | Status: DC

## 2011-04-23 NOTE — Op Note (Signed)
[facility] [address] [place], [place]  [address]  ENDOSCOPY PROCEDURE REPORT  PATIENT:  Grace Mccann, Grace Mccann  MR#:  [id number] BIRTHDATE:  05/16/1940, 70 yrs. old  GENDER:  female  ENDOSCOPIST:  Iva Boop, MD, [facility]  PROCEDURE DATE:  04/23/2011 PROCEDURE:  EGD with biopsy, 32440 ASA CLASS:  Class II INDICATIONS:  follow-up of gastric polyps - hyperplastic  MEDICATIONS:   These medications were titrated to patient response per physician's verbal order, Fentanyl 50 mcg IV, Versed 4 mg TOPICAL ANESTHETIC:  Cetacaine Spray  DESCRIPTION OF PROCEDURE:   After the risks benefits and alternatives of the procedure were thoroughly explained, informed consent was obtained.  The LB GIF-H180 [id number] endoscope was introduced through the mouth and advanced to the second portion of the duodenum, without limitations.  The instrument was slowly withdrawn as the mucosa was fully examined. <<PROCEDUREIMAGES>>  There were multiple polyps identified in the cardia. Three erythematous sessile polyps up to 1 cm size in proximal cardia - all biopsied. Numerous other polyps (erythematous and pale polyps) 5mm or less in fundus and body, multiple biopsies.  Moderate gastritis was found. Streaky and patchy intense erythema. Otherwise the examination was normal.    Retroflexed views revealed Retroflexion exam demonstrated findings as previously described. The scope was then withdrawn from the patient and the procedure completed.  COMPLICATIONS:  None  ENDOSCOPIC IMPRESSION: 1) Polyps, multiple in the cardia, body and fundus 2) Moderate gastritis - probably due to meloxicam (is on PPI) 3) Otherwise normal examination RECOMMENDATIONS: 1) Await biopsy results 2) Will notify. 3) Stay on PPI  REPEAT EXAM:  In for EGD, pending biopsy results.  Iva Boop, MD, [facility]  CC:  Joette Catching, MD, The Patient  n. eSIGNED:   Iva Boop at 04/23/2011 02:46 PM  [name],  [phone]

## 2011-04-23 NOTE — Progress Notes (Signed)
The pt tolerated the EGD very well. Maw

## 2011-04-23 NOTE — Patient Instructions (Addendum)
Multiple stomach polyps were seen and biopsied again. You also have an irritated stomach - called gastritis. That is probably from the meloxicam and/or aspirin you take. Omeprazole should prevent that from turning into ulcers but the less meloxicam you take the less the chance of ulcers. Iva Boop, MD, FACG  YOU HAD AN ENDOSCOPIC PROCEDURE TODAY AT THE [facility]: Refer to the procedure report that was given to you for any specific questions about what was found during the examination.  If the procedure report does not answer your questions, please call your gastroenterologist to clarify.  If you requested that your care partner not be given the details of your procedure findings, then the procedure report has been included in a sealed envelope for you to review at your convenience later.  YOU SHOULD EXPECT: Some feelings of bloating in the abdomen. Passage of more gas than usual.  Walking can help get rid of the air that was put into your GI tract during the procedure and reduce the bloating. If you had a lower endoscopy (such as a colonoscopy or flexible sigmoidoscopy) you may notice spotting of blood in your stool or on the toilet paper. If you underwent a bowel prep for your procedure, then you may not have a normal bowel movement for a few days.  DIET: Your first meal following the procedure should be a light meal and then it is ok to progress to your normal diet.  A half-sandwich or bowl of soup is an example of a good first meal.  Heavy or fried foods are harder to digest and may make you feel nauseous or bloated.  Likewise meals heavy in dairy and vegetables can cause extra gas to form and this can also increase the bloating.  Drink plenty of fluids but you should avoid alcoholic beverages for 24 hours.  ACTIVITY: Your care partner should take you home directly after the procedure.  You should plan to take it easy, moving slowly for the rest of the day.  You can resume normal  activity the day after the procedure however you should NOT DRIVE or use heavy machinery for 24 hours (because of the sedation medicines used during the test).    SYMPTOMS TO REPORT IMMEDIATELY: A gastroenterologist can be reached at any hour.  During normal business hours, 8:30 AM to 5:00 PM Monday through Friday, call [phone].  After hours and on weekends, please call the GI answering service at [phone] who will take a message and have the physician on call contact you.   Following lower endoscopy (colonoscopy or flexible sigmoidoscopy):  Excessive amounts of blood in the stool  Significant tenderness or worsening of abdominal pains  Swelling of the abdomen that is new, acute  Fever of 100F or higher  Following upper endoscopy (EGD)  Vomiting of blood or coffee ground material  New chest pain or pain under the shoulder blades  Painful or persistently difficult swallowing  New shortness of breath  Fever of 100F or higher  Black, tarry-looking stools  FOLLOW UP: If any biopsies were taken you will be contacted by phone or by letter within the next 1-3 weeks.  Call your gastroenterologist if you have not heard about the biopsies in 3 weeks.  Our staff will call the home number listed on your records the next business day following your procedure to check on you and address any questions or concerns that you may have at that time regarding the information given to  you following your procedure. This is a courtesy call and so if there is no answer at the home number and we have not heard from you through the emergency physician on call, we will assume that you have returned to your regular daily activities without incident.  SIGNATURES/CONFIDENTIALITY: You and/or your care partner have signed paperwork which will be entered into your electronic medical record.  These signatures attest to the fact that that the information above on your After Visit Summary has been reviewed and is  understood.  Full responsibility of the confidentiality of this discharge information lies with you and/or your care-partner.   Handout on gastritis Stay on your reflux medicine as directed We will mail you a letter in 1-2 weeks with the pathology results and Dr Marvell Fuller recommendations

## 2011-04-23 NOTE — Progress Notes (Signed)
Patient did not experience any of the following events: a burn prior to discharge; a fall within the facility; wrong site/side/patient/procedure/implant event; or a hospital transfer or hospital admission upon discharge from the facility. (G8907) Patient did not have preoperative order for IV antibiotic SSI prophylaxis. (G8918)  

## 2011-04-26 ENCOUNTER — Telehealth: Payer: Self-pay | Admitting: *Deleted

## 2011-04-26 NOTE — Telephone Encounter (Signed)
  Follow up Call-  Call back number 04/23/2011  Post procedure Call Back phone  # [phone]  Permission to leave phone message Yes     Patient questions:  Do you have a fever, pain , or abdominal swelling? no Pain Score  0 *  Have you tolerated food without any problems? yes  Have you been able to return to your normal activities? yes  Do you have any questions about your discharge instructions: Diet   no Medications  no Follow up visit  no  Do you have questions or concerns about your Care? no  Actions: * If pain score is 4 or above: No action needed, pain <4.

## 2011-04-29 ENCOUNTER — Encounter: Payer: Self-pay | Admitting: Internal Medicine

## 2011-04-29 NOTE — Progress Notes (Signed)
Quick Note:  Hyperplastic and fundic gland polyps Recall EGD 04/2012 ______

## 2011-05-11 ENCOUNTER — Encounter: Payer: Self-pay | Admitting: Internal Medicine

## 2012-05-12 ENCOUNTER — Encounter: Payer: Self-pay | Admitting: Internal Medicine

## 2012-08-04 ENCOUNTER — Ambulatory Visit: Payer: Medicare Other | Admitting: Internal Medicine

## 2012-08-14 ENCOUNTER — Encounter: Payer: Self-pay | Admitting: Internal Medicine

## 2012-08-14 ENCOUNTER — Ambulatory Visit (INDEPENDENT_AMBULATORY_CARE_PROVIDER_SITE_OTHER): Payer: Medicare Other | Admitting: Internal Medicine

## 2012-08-14 VITALS — BP 120/80 | HR 76 | Ht 66.0 in | Wt 217.4 lb

## 2012-08-14 DIAGNOSIS — K219 Gastro-esophageal reflux disease without esophagitis: Secondary | ICD-10-CM

## 2012-08-14 DIAGNOSIS — D131 Benign neoplasm of stomach: Secondary | ICD-10-CM

## 2012-08-14 DIAGNOSIS — K317 Polyp of stomach and duodenum: Secondary | ICD-10-CM

## 2012-08-14 MED ORDER — OMEPRAZOLE 40 MG PO CPDR: 40.0000 mg | DELAYED_RELEASE_CAPSULE | Freq: Two times a day (BID) | ORAL | Status: DC

## 2012-08-14 NOTE — Progress Notes (Signed)
Subjective:    Patient ID: Grace Mccann, female    DOB: [birth year], 72 y.o.   MRN: [id number]  HPI Patient returns in followup. She has a history of hyperplastic and fundic gland polyps I have followed over the years. Last procedure February 2013, results reviewed. She reports may need to use TUMS a fair amount. Particularly in the evening hours. She is taking omeprazole 40 mg daily. She is having heartburn and gas and pressure. It sounds like she might even have an occasional episode of dysphagia or choking.  Allergies  Allergen Reactions  . Niacin Other (See Comments)    "FLUSHED"   Outpatient Prescriptions Prior to Visit  Medication Sig Dispense Refill  . aspirin 81 MG tablet Take 81 mg by mouth daily.      Marland Kitchen EXFORGE HCT 10-160-25 MG TABS Take 1 tablet by mouth Daily.      . fluticasone (FLONASE) 50 MCG/ACT nasal spray Place 50 mcg into the nose Daily.      Marland Kitchen LORazepam (ATIVAN) 0.5 MG tablet Take 1 tablet by mouth as needed.      . meloxicam (MOBIC) 7.5 MG tablet Take 1 tablet by mouth Twice daily.      Marland Kitchen PREMARIN 0.3 MG tablet Take 1 tablet by mouth Daily.      . Probiotic Product (ALIGN PO) Take 1 tablet by mouth daily.      Lilian Kapur 3.75 G PACK Take 1 packet by mouth Daily.      Marland Kitchen ZETIA 10 MG tablet Take 1 tablet by mouth Daily.      Marland Kitchen omeprazole (PRILOSEC) 40 MG capsule Take 1 tablet by mouth Daily.       No facility-administered medications prior to visit.   Past Medical History  Diagnosis Date  . Hyperlipidemia   . Hypertension   . Anxiety   . Arthritis   . GERD (gastroesophageal reflux disease)   . Gastric polyps    Past Surgical History  Procedure Laterality Date  . Cholecystectomy    . Total abdominal hysterectomy    . Upper gastrointestinal endoscopy    . Colonoscopy     Review of Systems She does not have other complaints today, otherwise as per history of present illness    Objective:   Physical Exam General:  NAD Eyes:   anicteric Lungs:   clear Heart:  S1S2 no rubs, murmurs or gallops Abdomen:  Obese soft and nontender, BS+ Ext:   no edema    Data Reviewed:  Wt Readings from Last 3 Encounters:  08/14/12 217 lb 6 oz (98.601 kg)  04/23/11 213 lb (96.616 kg)  04/09/11 213 lb (96.616 kg)      Assessment & Plan:   1. Benign neoplasm of stomach - hyperplastic and fundic gland polyps  2. GERD (gastroesophageal reflux disease) with some question of rare dysphagia    1. Will schedule surveillance endoscopy at the hospital with plans for possible snare polypectomy as well as biopsies. Though typically hyperplastic and definitely fundic gland polyps don't have much malignancy potential, I think that the hyperplastic ones there could be in these need to be moved if growing, there is also a blood loss risk from these over time to 2. Will hold meloxicam and aspirin one week prior 3. Add a nighttime dose before supper of omeprazole 40 mg for reflux it appears to be uncontrolled. 4. New prescription given to the patient for the twice a day omeprazole to 5. GERD diet handout provided to the  patient further review, most like she tends to go to bed more than 3 hours after eating but some nights not. The risks and benefits as well as alternatives of endoscopic procedure(s) have been discussed and reviewed. All questions answered. The patient agrees to proceed. I've also explain the somewhat higher though rare risk of bleeding after polypectomy in the stomach She could need an esophageal dilation as well as there may be a history of dysphagia  CC: Josue Hector, MD

## 2012-08-14 NOTE — Patient Instructions (Addendum)
You have been scheduled for an endoscopyat [facility]. Please follow written instructions given to you at your visit today. If you use inhalers (even only as needed), please bring them with you on the day of your procedure. Your physician has requested that you go to www.startemmi.com and enter the access code given to you at your visit today. This web site gives a general overview about your procedure. However, you should still follow specific instructions given to you by our office regarding your preparation for the procedure.  Hold your Asprin and Mobic one week prior to your procedure.  We are giving you a printed rx for the omeprazole today to fill when you need.  We are giving you a GERD handout to read and follow today.    I appreciate the opportunity to care for you.

## 2012-09-20 ENCOUNTER — Encounter (HOSPITAL_COMMUNITY)
Admission: RE | Disposition: A | Discharge status: Home / Self Care | Payer: Self-pay | Source: Ambulatory Visit | Attending: Internal Medicine

## 2012-09-20 ENCOUNTER — Ambulatory Visit (HOSPITAL_COMMUNITY)
Admission: RE | Admit: 2012-09-20 | Discharge: 2012-09-20 | Disposition: A | Discharge status: Home / Self Care | Payer: Medicare Other | Source: Ambulatory Visit | Attending: Internal Medicine | Admitting: Internal Medicine

## 2012-09-20 ENCOUNTER — Encounter (HOSPITAL_COMMUNITY): Payer: Self-pay

## 2012-09-20 DIAGNOSIS — Z7982 Long term (current) use of aspirin: Secondary | ICD-10-CM | POA: Insufficient documentation

## 2012-09-20 DIAGNOSIS — Z791 Long term (current) use of non-steroidal anti-inflammatories (NSAID): Secondary | ICD-10-CM | POA: Insufficient documentation

## 2012-09-20 DIAGNOSIS — E785 Hyperlipidemia, unspecified: Secondary | ICD-10-CM | POA: Insufficient documentation

## 2012-09-20 DIAGNOSIS — Z803 Family history of malignant neoplasm of breast: Secondary | ICD-10-CM | POA: Insufficient documentation

## 2012-09-20 DIAGNOSIS — Z79899 Other long term (current) drug therapy: Secondary | ICD-10-CM | POA: Insufficient documentation

## 2012-09-20 DIAGNOSIS — M129 Arthropathy, unspecified: Secondary | ICD-10-CM | POA: Insufficient documentation

## 2012-09-20 DIAGNOSIS — F411 Generalized anxiety disorder: Secondary | ICD-10-CM | POA: Insufficient documentation

## 2012-09-20 DIAGNOSIS — D131 Benign neoplasm of stomach: Secondary | ICD-10-CM

## 2012-09-20 DIAGNOSIS — Z09 Encounter for follow-up examination after completed treatment for conditions other than malignant neoplasm: Secondary | ICD-10-CM | POA: Insufficient documentation

## 2012-09-20 DIAGNOSIS — I10 Essential (primary) hypertension: Secondary | ICD-10-CM | POA: Insufficient documentation

## 2012-09-20 DIAGNOSIS — K219 Gastro-esophageal reflux disease without esophagitis: Secondary | ICD-10-CM

## 2012-09-20 HISTORY — PX: ESOPHAGOGASTRODUODENOSCOPY: SHX5428

## 2012-09-20 SURGERY — EGD (ESOPHAGOGASTRODUODENOSCOPY)
Anesthesia: Moderate Sedation

## 2012-09-20 MED ORDER — SODIUM CHLORIDE 0.9 % IV SOLN: INTRAVENOUS | Status: DC

## 2012-09-20 MED ORDER — BUTAMBEN-TETRACAINE-BENZOCAINE 2-2-14 % EX AERO
INHALATION_SPRAY | CUTANEOUS | Status: DC | PRN
  Administered 2012-09-20: 2 via TOPICAL

## 2012-09-20 MED ORDER — MIDAZOLAM HCL 10 MG/2ML IJ SOLN
INTRAMUSCULAR | Status: DC | PRN
  Administered 2012-09-20 (×2): 1 mg via INTRAVENOUS
  Administered 2012-09-20: 2 mg via INTRAVENOUS
  Administered 2012-09-20: 1 mg via INTRAVENOUS
  Administered 2012-09-20: 2 mg via INTRAVENOUS

## 2012-09-20 MED ORDER — MIDAZOLAM HCL 10 MG/2ML IJ SOLN
INTRAMUSCULAR | Status: AC
  Filled 2012-09-20: qty 4

## 2012-09-20 MED ORDER — FENTANYL CITRATE 0.05 MG/ML IJ SOLN
INTRAMUSCULAR | Status: DC | PRN
  Administered 2012-09-20 (×2): 25 ug via INTRAVENOUS
  Administered 2012-09-20: 50 ug via INTRAVENOUS

## 2012-09-20 MED ORDER — ASPIRIN 81 MG PO TABS: 81.0000 mg | ORAL_TABLET | Freq: Every day | ORAL | Status: DC

## 2012-09-20 MED ORDER — FENTANYL CITRATE 0.05 MG/ML IJ SOLN
INTRAMUSCULAR | Status: AC
  Filled 2012-09-20: qty 4

## 2012-09-20 MED ORDER — MELOXICAM 7.5 MG PO TABS: 7.5000 mg | ORAL_TABLET | Freq: Every day | ORAL | Status: DC

## 2012-09-20 NOTE — H&P (Signed)
[facility]   Primary Care Physician:  Josue Hector, MD Primary Gastroenterologist:  Dr. Leone Payor  Reason for EGD: follow-up gastric polyps/GERD  HPI: Grace Mccann is a 72 y.o. female with a history of GERD and gastric polyps - fundic gland and hyperplastic. She is for follow-up and possible removal of polyps.   Past Medical History  Diagnosis Date  . Hyperlipidemia   . Hypertension   . Anxiety   . Arthritis   . GERD (gastroesophageal reflux disease)   . Gastric polyps     Past Surgical History  Procedure Laterality Date  . Cholecystectomy    . Total abdominal hysterectomy    . Upper gastrointestinal endoscopy    . Colonoscopy      Prior to Admission medications   Medication Sig Start Date End Date Taking? Authorizing Provider  aspirin 81 MG tablet Take 81 mg by mouth daily.   Yes Historical Provider, MD  EXFORGE HCT 10-160-25 MG TABS Take 1 tablet by mouth Daily. 02/25/11  Yes Historical Provider, MD  fluticasone (FLONASE) 50 MCG/ACT nasal spray Place 50 mcg into the nose Daily. 04/09/11  Yes Historical Provider, MD  LORazepam (ATIVAN) 0.5 MG tablet Take 1 tablet by mouth as needed. 04/05/11  Yes Historical Provider, MD  meloxicam (MOBIC) 7.5 MG tablet Take 1 tablet by mouth Twice daily. 01/24/11  Yes Historical Provider, MD  omeprazole (PRILOSEC) 40 MG capsule Take 1 capsule (40 mg total) by mouth 2 (two) times daily before a meal. Breakfast and supper 08/14/12  Yes Iva Boop, MD  PREMARIN 0.3 MG tablet Take 1 tablet by mouth Daily. 03/18/11  Yes Historical Provider, MD  Probiotic Product (ALIGN PO) Take 1 tablet by mouth daily.   Yes Historical Provider, MD  [facility] 3.75 G PACK Take 1 packet by mouth Daily. 03/18/11  Yes Historical Provider, MD  ZETIA 10 MG tablet Take 1 tablet by mouth Daily. 03/08/11  Yes Historical Provider, MD    Current Facility-Administered Medications  Medication Dose Route Frequency Provider Last Rate Last Dose  . 0.9 %   sodium chloride infusion   Intravenous Continuous Iva Boop, MD        Allergies as of 08/14/2012 - Review Complete 08/14/2012  Allergen Reaction Noted  . Niacin Other (See Comments)     Family History  Problem Relation Age of Onset  . Colon cancer Neg Hx   . Breast cancer Mother   . Alzheimer's disease Father     History   Social History  . Marital Status: Married    Spouse Name: N/A    Number of Children: N/A  . Years of Education: N/A   Occupational History  . Not on file.   Social History Main Topics  . Smoking status: Never Smoker   . Smokeless tobacco: Never Used  . Alcohol Use: No  . Drug Use: No  . Sexually Active:    Other Topics Concern  . Not on file   Social History Narrative  . No narrative on file    Review of Systems: as mentioned in the HPI.  Physical Exam: Vital signs in last 24 hours: Temp:  [98 F (36.7 C)] 98 F (36.7 C) (07/23 0731) Pulse Rate:  [75] 75 (07/23 0835) Resp:  [11-17] 11 (07/23 0835) BP: (142-158)/(85-87) 142/87 mmHg (07/23 0835) SpO2:  [93 %-98 %] 93 % (07/23 0835)   General:   Alert,  Well-developed, well-nourished, pleasant and cooperative in NAD Mouth:  No deformity or lesions.  Oropharynx pink & moist. Lungs:  Clear throughout to auscultation.   Heart:  Regular rate and rhythm; no murmurs, clicks, rubs,  or gallops. Abdomen:  Soft, nontender and nondistended. Normal bowel sounds, without guarding, and without rebound.   Neurologic:  Alert and  oriented x 3 Psych:  Alert and cooperative. Normal mood and affect.    LOS: 0 days      @Carl  Sena Slate, MD, [facility] [phone] (pager) 09/20/2012 8:41 AM@

## 2012-09-20 NOTE — Op Note (Signed)
[facility] [address] [place], [address]   ENDOSCOPY PROCEDURE REPORT  PATIENT: Grace Mccann, Grace Mccann  MR#: [id number] BIRTHDATE: 17-Nov-1940 , 72  yrs. old GENDER: Female ENDOSCOPIST: Iva Boop, MD, [facility] PROCEDURE DATE:  09/20/2012 PROCEDURE:  EGD w/ biopsy and EGD w/ snare technique ASA CLASS:     Class II INDICATIONS:  Surveillance/Removal gastric polyps MEDICATIONS: Fentanyl 100 mcg IV and Versed 8 mg IV TOPICAL ANESTHETIC: Cetacaine Spray  DESCRIPTION OF PROCEDURE: After the risks benefits and alternatives of the procedure were thoroughly explained, informed consent was obtained.  The Pentax Gastroscope [id number] endoscope was introduced through the mouth and advanced to the second portion of the duodenum. Without limitations.  The instrument was slowly withdrawn as the mucosa was fully examined.      STOMACH: Five sessile polyps measuring 8-12 mm in size were found in the cardia. They were friable and oozed blood with contact, and looked like prior hyperplastic polyps. A polypectomy was performed with snare cautery.  The resection was complete for some and incomplete for some and the polyp tissue was completely retrieved. Two clips applied to largest site.    Two polyps measuring 10-15 mm in size were found in the gastric body.  Looked like large fundic gland polyps or a collection of smaller ones. Multiple biopsies was performed using cold forceps.  Sample sent for histology.  The remainder of the upper endoscopy exam was otherwise normal. Retroflexed views revealed as above.     The scope was then withdrawn from the patient and the procedure completed.  COMPLICATIONS: There were no complications. ENDOSCOPIC IMPRESSION: 1.   Five sessile polyps measuring 8-12 mm in size were found in the cardia - snared - look hyperplastic 2.   Two polyps measuring 10-15 mm in size were found in the gastric body - biopsied - look like fundic gland polyps 3.    The remainder of the upper endoscopy exam was otherwise normal  RECOMMENDATIONS: 1.  Avoid NSAIDS for two weeks 2.   bid omeprazole further plans pending pathology - consider MAC in future   eSigned:  Iva Boop, MD, [facility] 09/20/2012 9:29 AM  XB:[id number] Lysbeth Galas, MD and The Patient

## 2012-09-21 ENCOUNTER — Encounter (HOSPITAL_COMMUNITY): Payer: Self-pay | Admitting: Internal Medicine

## 2012-09-24 ENCOUNTER — Encounter: Payer: Self-pay | Admitting: Internal Medicine

## 2012-09-24 NOTE — Progress Notes (Signed)
Quick Note:  Hyperplastic and fundic gland polyps Repeat egd 1 year ______

## 2013-10-03 ENCOUNTER — Encounter: Payer: Self-pay | Admitting: Internal Medicine

## 2013-12-05 ENCOUNTER — Encounter: Payer: Self-pay | Admitting: Internal Medicine

## 2014-01-07 ENCOUNTER — Other Ambulatory Visit: Payer: Self-pay

## 2014-01-07 MED ORDER — OMEPRAZOLE 40 MG PO CPDR: 40.0000 mg | DELAYED_RELEASE_CAPSULE | Freq: Two times a day (BID) | ORAL | Status: DC

## 2014-02-11 ENCOUNTER — Encounter: Payer: Self-pay | Admitting: Internal Medicine

## 2014-03-18 ENCOUNTER — Telehealth: Payer: Self-pay | Admitting: Internal Medicine

## 2014-03-18 NOTE — Telephone Encounter (Signed)
Yes she is due for an endo and colon.  Ok to schedule both together

## 2014-03-21 ENCOUNTER — Encounter: Payer: Self-pay | Admitting: Internal Medicine

## 2014-03-21 NOTE — Telephone Encounter (Signed)
Left a message for patient to c/b and schedule a double procedure.

## 2014-04-15 ENCOUNTER — Other Ambulatory Visit: Payer: Self-pay | Admitting: Internal Medicine

## 2014-05-11 ENCOUNTER — Other Ambulatory Visit: Payer: Self-pay | Admitting: Internal Medicine

## 2014-05-22 ENCOUNTER — Ambulatory Visit (AMBULATORY_SURGERY_CENTER): Payer: Self-pay

## 2014-05-22 VITALS — Ht 66.0 in | Wt 215.0 lb

## 2014-05-22 DIAGNOSIS — D131 Benign neoplasm of stomach: Secondary | ICD-10-CM

## 2014-05-22 DIAGNOSIS — Z1211 Encounter for screening for malignant neoplasm of colon: Secondary | ICD-10-CM

## 2014-05-22 NOTE — Progress Notes (Signed)
No allergies to eggs or soy No diet/weight loss meds No home oxygen No past problems with anesthesia  No email 

## 2014-06-07 ENCOUNTER — Ambulatory Visit (AMBULATORY_SURGERY_CENTER): Payer: Medicare Other | Admitting: Internal Medicine

## 2014-06-07 ENCOUNTER — Encounter: Payer: Self-pay | Admitting: Internal Medicine

## 2014-06-07 VITALS — BP 110/59 | HR 71 | Temp 96.5°F | Resp 23 | Ht 66.0 in | Wt 215.0 lb

## 2014-06-07 DIAGNOSIS — K317 Polyp of stomach and duodenum: Secondary | ICD-10-CM | POA: Diagnosis not present

## 2014-06-07 DIAGNOSIS — D123 Benign neoplasm of transverse colon: Secondary | ICD-10-CM | POA: Diagnosis not present

## 2014-06-07 DIAGNOSIS — D122 Benign neoplasm of ascending colon: Secondary | ICD-10-CM

## 2014-06-07 DIAGNOSIS — D120 Benign neoplasm of cecum: Secondary | ICD-10-CM

## 2014-06-07 DIAGNOSIS — D125 Benign neoplasm of sigmoid colon: Secondary | ICD-10-CM

## 2014-06-07 DIAGNOSIS — D131 Benign neoplasm of stomach: Secondary | ICD-10-CM

## 2014-06-07 DIAGNOSIS — Z1211 Encounter for screening for malignant neoplasm of colon: Secondary | ICD-10-CM | POA: Diagnosis not present

## 2014-06-07 MED ORDER — SODIUM CHLORIDE 0.9 % IV SOLN: 500.0000 mL | INTRAVENOUS | Status: DC

## 2014-06-07 NOTE — Patient Instructions (Addendum)
You had one polyp in the stomach that was biopsied and I checked the site where other polyp was removed. A few tiny polyps that I did not biopsy also.  You grew polyps in the colon since last one - I found and removed 8 polyps and two were  medium to large size. Others small.  I will let you know pathology results and when to have another routine colonoscopy by mail.  I appreciate the opportunity to care for you. Gatha Mayer, MD, FACG  YOU HAD AN ENDOSCOPIC PROCEDURE TODAY AT [facility]:   Refer to the procedure report that was given to you for any specific questions about what was found during the examination.  If the procedure report does not answer your questions, please call your gastroenterologist to clarify.  If you requested that your care partner not be given the details of your procedure findings, then the procedure report has been included in a sealed envelope for you to review at your convenience later.  YOU SHOULD EXPECT: Some feelings of bloating in the abdomen. Passage of more gas than usual.  Walking can help get rid of the air that was put into your GI tract during the procedure and reduce the bloating. If you had a lower endoscopy (such as a colonoscopy or flexible sigmoidoscopy) you may notice spotting of blood in your stool or on the toilet paper. If you underwent a bowel prep for your procedure, you may not have a normal bowel movement for a few days.  Please Note:  You might notice some irritation and congestion in your nose or some drainage.  This is from the oxygen used during your procedure.  There is no need for concern and it should clear up in a day or so.  SYMPTOMS TO REPORT IMMEDIATELY:   Following lower endoscopy (colonoscopy or flexible sigmoidoscopy):  Excessive amounts of blood in the stool  Significant tenderness or worsening of abdominal pains  Swelling of the abdomen that is new, acute  Fever of 100F or higher   Following  upper endoscopy (EGD)  Vomiting of blood or coffee ground material  New chest pain or pain under the shoulder blades  Painful or persistently difficult swallowing  New shortness of breath  Fever of 100F or higher  Black, tarry-looking stools  For urgent or emergent issues, a gastroenterologist can be reached at any hour by calling [phone].   DIET: Your first meal following the procedure should be a small meal and then it is ok to progress to your normal diet. Heavy or fried foods are harder to digest and may make you feel nauseous or bloated.  Likewise, meals heavy in dairy and vegetables can increase bloating.  Drink plenty of fluids but you should avoid alcoholic beverages for 24 hours.  ACTIVITY:  You should plan to take it easy for the rest of today and you should NOT DRIVE or use heavy machinery until tomorrow (because of the sedation medicines used during the test).    FOLLOW UP: Our staff will call the number listed on your records the next business day following your procedure to check on you and address any questions or concerns that you may have regarding the information given to you following your procedure. If we do not reach you, we will leave a message.  However, if you are feeling well and you are not experiencing any problems, there is no need to return our call.  We will assume  that you have returned to your regular daily activities without incident.  If any biopsies were taken you will be contacted by phone or by letter within the next 1-3 weeks.  Please call us at [phone] if you have not heard about the biopsies in 3 weeks.    SIGNATURES/CONFIDENTIALITY: You and/or your care partner have signed paperwork which will be entered into your electronic medical record.  These signatures attest to the fact that that the information above on your After Visit Summary has been reviewed and is understood.  Full responsibility of the confidentiality of this discharge  information lies with you and/or your care-partner.YOU HAD AN ENDOSCOPIC PROCEDURE TODAY AT [facility]:   Refer to the procedure report that was given to you for any specific questions about what was found during the examination.  If the procedure report does not answer your questions, please call your gastroenterologist to clarify.  If you requested that your care partner not be given the details of your procedure findings, then the procedure report has been included in a sealed envelope for you to review at your convenience later.  YOU SHOULD EXPECT: Some feelings of bloating in the abdomen. Passage of more gas than usual.  Walking can help get rid of the air that was put into your GI tract during the procedure and reduce the bloating. If you had a lower endoscopy (such as a colonoscopy or flexible sigmoidoscopy) you may notice spotting of blood in your stool or on the toilet paper. If you underwent a bowel prep for your procedure, you may not have a normal bowel movement for a few days.  Please Note:  You might notice some irritation and congestion in your nose or some drainage.  This is from the oxygen used during your procedure.  There is no need for concern and it should clear up in a day or so.  SYMPTOMS TO REPORT IMMEDIATELY:   Following lower endoscopy (colonoscopy or flexible sigmoidoscopy):  Excessive amounts of blood in the stool  Significant tenderness or worsening of abdominal pains  Swelling of the abdomen that is new, acute  Fever of 100F or higher   Following upper endoscopy (EGD)  Vomiting of blood or coffee ground material  New chest pain or pain under the shoulder blades  Painful or persistently difficult swallowing  New shortness of breath  Fever of 100F or higher  Black, tarry-looking stools  For urgent or emergent issues, a gastroenterologist can be reached at any hour by calling [phone].   DIET: Your first meal following the procedure  should be a small meal and then it is ok to progress to your normal diet. Heavy or fried foods are harder to digest and may make you feel nauseous or bloated.  Likewise, meals heavy in dairy and vegetables can increase bloating.  Drink plenty of fluids but you should avoid alcoholic beverages for 24 hours.  ACTIVITY:  You should plan to take it easy for the rest of today and you should NOT DRIVE or use heavy machinery until tomorrow (because of the sedation medicines used during the test).    FOLLOW UP: Our staff will call the number listed on your records the next business day following your procedure to check on you and address any questions or concerns that you may have regarding the information given to you following your procedure. If we do not reach you, we will leave a message.  However, if you are feeling well  and you are not experiencing any problems, there is no need to return our call.  We will assume that you have returned to your regular daily activities without incident.  If any biopsies were taken you will be contacted by phone or by letter within the next 1-3 weeks.  Please call us at [phone] if you have not heard about the biopsies in 3 weeks.    SIGNATURES/CONFIDENTIALITY: You and/or your care partner have signed paperwork which will be entered into your electronic medical record.  These signatures attest to the fact that that the information above on your After Visit Summary has been reviewed and is understood.  Full responsibility of the confidentiality of this discharge information lies with you and/or your care-partner.   Polyp information given.  Hold aspirin and all NSAIDS FOR TWO WEEKS.

## 2014-06-07 NOTE — Progress Notes (Signed)
To recovery, report to Scott, RN, VSS 

## 2014-06-07 NOTE — Progress Notes (Signed)
Called to room to assist during endoscopic procedure.  Patient ID and intended procedure confirmed with present staff. Received instructions for my participation in the procedure from the performing physician.  

## 2014-06-07 NOTE — Op Note (Signed)
[facility]  [address] [place], [address]   COLONOSCOPY PROCEDURE REPORT  PATIENT: Alazae, Crymes  MR#: [id number] BIRTHDATE: May 03, 1940 , 74  yrs. old GENDER: female ENDOSCOPIST: Gatha Mayer, MD, [facility] PROCEDURE DATE:  06/07/2014 PROCEDURE:   Colonoscopy, surveillance and Colonoscopy with snare polypectomy First Screening Colonoscopy - Avg.  risk and is 50 yrs.  old or older - No.  Prior Negative Screening - Now for repeat screening. 10 or more years since last screening  History of Adenoma - Now for follow-up colonoscopy & has been > or = to 3 yrs.  N/A ASA CLASS:   Class II INDICATIONS:Screening for colonic neoplasia and Colorectal Neoplasm Risk Assessment for this procedure is average risk. MEDICATIONS: Residual sedation present, Propofol 200 mg IV, and Monitored anesthesia care  DESCRIPTION OF PROCEDURE:   After the risks benefits and alternatives of the procedure were thoroughly explained, informed consent was obtained.  The digital rectal exam revealed no abnormalities of the rectum.   The LB [id number]  endoscope was introduced through the anus and advanced to the cecum, which was identified by both the appendix and ileocecal valve. No adverse events experienced.   The quality of the prep was excellent. (MiraLax was used)  The instrument was then slowly withdrawn as the colon was fully examined.      COLON FINDINGS: Eight polyps were found.  Polypectomies were performed using snare cautery (cecum and sigmoid x 2 max 12 mm)and with a cold snare (4 diminutive transverse polyps - 3 of 4 retrieved)..  The resection was complete, the polyp tissue was retrieved and sent to histology.  A polypectomy was performed in a piecemeal fashion using snare cautery - ascending 15-18 mm polyp) The resection was complete, the polyp tissue was completely retrieved and sent to histology.   The examination was otherwise normal.  Retroflexed views revealed  no abnormalities. The time to cecum = 7.9 Withdrawal time = 19.7   The scope was withdrawn and the procedure completed. COMPLICATIONS: There were no immediate complications.  ENDOSCOPIC IMPRESSION: 1.   Eight polyps were found; polypectomies were performed using snare cautery and with a cold snare; polypectomy was performed in a piecemeal fashion using snare cautery - largest 15-18 mm ascending 2.   The examination was otherwise normal - excellent prep  RECOMMENDATIONS: 1.  Hold Aspirin and all other NSAIDS for 2 weeks. 2.  Timing of repeat colonoscopy will be determined by pathology findings.  eSigned:  Gatha Mayer, MD, [facility] 06/07/2014 8:46 AM   cc: The Patient and Dione Housekeeper, MD   PATIENT NAME:  Ardythe, Klute MR#: [id number]

## 2014-06-07 NOTE — Op Note (Signed)
[facility]  [address] [place], [address]   ENDOSCOPY PROCEDURE REPORT  PATIENT: Yena, Tisby  MR#: [id number] BIRTHDATE: [birth year] , 74  yrs. old GENDER: female ENDOSCOPIST: Gatha Mayer, MD, [facility] PROCEDURE DATE:  06/07/2014 PROCEDURE:  EGD w/ biopsy ASA CLASS:     Class II INDICATIONS:  f/u gastric polyps. MEDICATIONS: Propofol 200 mg IV and Monitored anesthesia care TOPICAL ANESTHETIC: none  DESCRIPTION OF PROCEDURE: After the risks benefits and alternatives of the procedure were thoroughly explained, informed consent was obtained.  The LB [id number] endoscope was introduced through the mouth and advanced to the second portion of the duodenum , Without limitations.  The instrument was slowly withdrawn as the mucosa was fully examined.    1) 5-6 mm sessile cardia polyp biopsied. 2) Clip at prior polypectomy site - biopsied area ? any residual polyp - cardia 3) A few fundic gland polyps in body - all < 5 mm. 4) otherwise normal EGD.  Retroflexed views revealed as previously described.     The scope was then withdrawn from the patient and the procedure completed.  COMPLICATIONS: There were no immediate complications.  ENDOSCOPIC IMPRESSION: 1) 5-6 mm sessile cardia polyp biopsied. 2) Clip at prior polypectomy site - biopsied area ? any residual polyp - cardia 3) A few fundic gland polyps in body - all < 5 mm. 4) otherwise normal EGD  RECOMMENDATIONS: 1.  Await pathology results 2.  Proceed with a Colonoscopy.  eSigned:  Gatha Mayer, MD, [facility] 06/07/2014 8:39 AM    CC: The Patient and Dione Housekeeper, MD

## 2014-06-10 ENCOUNTER — Telehealth: Payer: Self-pay | Admitting: *Deleted

## 2014-06-10 NOTE — Telephone Encounter (Signed)
Left message that we called for f/u 

## 2014-06-13 ENCOUNTER — Encounter: Payer: Self-pay | Admitting: Internal Medicine

## 2014-06-13 DIAGNOSIS — Z86010 Personal history of colonic polyps: Secondary | ICD-10-CM | POA: Insufficient documentation

## 2014-06-13 DIAGNOSIS — Z860101 Personal history of adenomatous and serrated colon polyps: Secondary | ICD-10-CM | POA: Insufficient documentation

## 2014-06-13 DIAGNOSIS — K317 Polyp of stomach and duodenum: Secondary | ICD-10-CM

## 2014-06-13 HISTORY — DX: Personal history of colonic polyps: Z86.010

## 2014-06-13 HISTORY — DX: Personal history of adenomatous and serrated colon polyps: Z86.0101

## 2014-07-31 ENCOUNTER — Other Ambulatory Visit: Payer: Self-pay | Admitting: Internal Medicine

## 2015-01-05 NOTE — Progress Notes (Signed)
Patient ID: Grace Mccann, female   DOB: 1940-08-06, 74 y.o.   MRN: [id number]     Cardiology Office Note   Date:  01/07/2015   ID:  Grace Mccann, DOB 06/18/1940, MRN [id number]  PCP:  Sherrie Mustache, MD  Cardiologist:   Jenkins Rouge, MD   No chief complaint on file.     History of Present Illness: Grace Mccann is a 74 y.o. female who presents for evaluation of chest pain:  CRF;s HTN, elevated lipids.  History of syncope 2011 One report in Epic of 17% LICA stenosis.  Chest discomfort related to food not helped with protonix.  Occasional dyspnea  Pressure something stuck in her mid chest.  No radiation or arm or neck  Still with reflux symptoms When eats at [employer] or lays flat at night.  Barrium swallow ordered by primary Dysmotility study was normal Stopped her welchol but this has not helped much She is sedentary.  Hard to walk due to arthritis.  Has not had stress test before      Past Medical History  Diagnosis Date  . Hyperlipidemia   . Hypertension   . Anxiety   . Arthritis   . GERD (gastroesophageal reflux disease)   . Gastric polyps   . Hx of adenomatous colonic polyps 06/13/2014    Past Surgical History  Procedure Laterality Date  . Cholecystectomy    . Total abdominal hysterectomy    . Upper gastrointestinal endoscopy    . Colonoscopy    . Esophagogastroduodenoscopy N/A 09/20/2012    Procedure: ESOPHAGOGASTRODUODENOSCOPY (EGD);  Surgeon: Gatha Mayer, MD;  Location: [facility];  Service: Endoscopy;  Laterality: N/A;     Current Outpatient Prescriptions  Medication Sig Dispense Refill  . acetaminophen (TYLENOL) 650 MG CR tablet Take 650 mg by mouth every 8 (eight) hours as needed. (PAIN)    . aspirin 81 MG tablet Take 1 tablet (81 mg total) by mouth daily. 30 tablet   . Coenzyme Q10 (COQ10 PO) Take 1 capsule by mouth daily.    . colesevelam (WELCHOL) 625 MG tablet Take six (6) tablets (3,750 mg total) by mouth daily.    Marland Kitchen EXFORGE HCT  10-160-25 MG TABS Take 1 tablet by mouth Daily.    . fluticasone (FLONASE) 50 MCG/ACT nasal spray Place 1 spray into both nostrils daily as needed for allergies or rhinitis.    Marland Kitchen LORazepam (ATIVAN) 0.5 MG tablet Take 0.5 mg by mouth every 6 (six) hours as needed. (ANXIETY)    . pantoprazole (PROTONIX) 40 MG tablet Take 40 mg by mouth daily.    Marland Kitchen PREMARIN 0.3 MG tablet Take 1 tablet by mouth Daily.    . Probiotic Product (ALIGN PO) Take 1 tablet by mouth daily.    Marland Kitchen ZETIA 10 MG tablet Take 1 tablet by mouth Daily.     No current facility-administered medications for this visit.    Allergies:   Niacin    Social History:  The patient  reports that she has never smoked. She has never used smokeless tobacco. She reports that she does not drink alcohol or use illicit drugs.   Family History:  The patient's family history includes Alzheimer's disease in her father; Breast cancer in her mother. There is no history of Colon cancer, Esophageal cancer, Rectal cancer, or Stomach cancer.    ROS:  Please see the history of present illness.   Otherwise, review of systems are positive for none.   All other systems are  reviewed and negative.    PHYSICAL EXAM: VS:  BP 124/82 mmHg  Pulse 86  Ht 5\' 6"  (1.676 m)  Wt 98.158 kg (216 lb 6.4 oz)  BMI 34.94 kg/m2  SpO2 95% , BMI Body mass index is 34.94 kg/(m^2). Affect appropriate Healthy:  appears stated age 75: normal Neck supple with no adenopathy JVP normal faint left  bruits no thyromegaly Lungs clear with no wheezing and good diaphragmatic motion Heart:  S1/S2 no murmur, no rub, gallop or click PMI normal Abdomen: benighn, BS positve, no tenderness, no AAA no bruit.  No HSM or HJR Distal pulses intact with no bruits No edema Neuro non-focal Skin warm and dry No muscular weakness    EKG:  SR low voltage rate 88 PAC nonspecific ST changes    Recent Labs: No results found for requested labs within last 365 days.    Lipid Panel      Component Value Date/Time   CHOL  05/26/2009 0515    174        ATP III CLASSIFICATION:  <200     mg/dL   Desirable  200-239  mg/dL   Borderline High  >=240    mg/dL   High          TRIG 177* 05/26/2009 0515   HDL 50 05/26/2009 0515   CHOLHDL 3.5 05/26/2009 0515   VLDL 35 05/26/2009 0515   LDLCALC  05/26/2009 0515    89        Total Cholesterol/HDL:CHD Risk Coronary Heart Disease Risk Table                     Men   Women  1/2 Average Risk   3.4   3.3  Average Risk       5.0   4.4  2 X Average Risk   9.6   7.1  3 X Average Risk  23.4   11.0        Use the calculated Patient Ratio above and the CHD Risk Table to determine the patient's CHD Risk.        ATP III CLASSIFICATION (LDL):  <100     mg/dL   Optimal  100-129  mg/dL   Near or Above                    Optimal  130-159  mg/dL   Borderline  160-189  mg/dL   High  >190     mg/dL   Very High      Wt Readings from Last 3 Encounters:  01/07/15 98.158 kg (216 lb 6.4 oz)  06/07/14 97.523 kg (215 lb)  05/22/14 97.523 kg (215 lb)      Other studies Reviewed: Additional studies/ records that were reviewed today include:  Epic notes and records from referring doctor [facility] .    ASSESSMENT AND PLAN:  1.  Chest Pain:  Clinically sounds like GI but paucity of findings on EGD/Barrium swallow Persistent symptoms despite protonix  ECG with some abnormalities Unable to exercise f/u lexiscan myovue 2. HTN: Well controlled.  Continue current medications and low sodium Dash type diet.   3. Chol  On zetia intolerant to niacin and statins f/u [place] 4. History of left 40% ICA stenosis f/u duplex ASA    Current medicines are reviewed at length with the patient today.  The patient does not have concerns regarding medicines.  The following changes have been made:  no change  Labs/  tests ordered today include:  Carotid Duplex and Lexiscan myovue  No orders of the defined types were placed in this encounter.      Disposition:   FU with me in a year if tests ok      Signed, Jenkins Rouge, MD  01/07/2015 3:35 PM    [facility] [address], [place], [place]  [address] Phone: [phone]; Fax: [phone]

## 2015-01-07 ENCOUNTER — Encounter: Payer: Self-pay | Admitting: Cardiovascular Disease

## 2015-01-07 ENCOUNTER — Ambulatory Visit (INDEPENDENT_AMBULATORY_CARE_PROVIDER_SITE_OTHER): Payer: Medicare Other | Admitting: Cardiovascular Disease

## 2015-01-07 VITALS — BP 124/82 | HR 86 | Ht 66.0 in | Wt 216.4 lb

## 2015-01-07 DIAGNOSIS — R079 Chest pain, unspecified: Secondary | ICD-10-CM | POA: Diagnosis not present

## 2015-01-07 DIAGNOSIS — I6529 Occlusion and stenosis of unspecified carotid artery: Secondary | ICD-10-CM

## 2015-01-07 NOTE — Patient Instructions (Addendum)
Medication Instructions:  Your physician recommends that you continue on your current medications as directed. Please refer to the Current Medication list given to you today.   Labwork: NONE Testing/Procedures: Your physician has requested that you have a carotid duplex. This test is an ultrasound of the carotid arteries in your neck. It looks at blood flow through these arteries that supply the brain with blood. Allow one hour for this exam. There are no restrictions or special instructions.   Your physician has requested that you have a lexiscan myoview. For further information please visit [email or web address]. Please follow instruction sheet, as given.  Follow-Up: Your physician wants you to follow-up in: [place] will receive a reminder letter in the mail two months in advance. If you don't receive a letter, please call our office to schedule the follow-up appointment.   Any Other Special Instructions Will Be Listed Below (If Applicable).     If you need a refill on your cardiac medications before your next appointment, please call your pharmacy.

## 2015-01-14 ENCOUNTER — Telehealth (HOSPITAL_COMMUNITY): Payer: Self-pay | Admitting: *Deleted

## 2015-01-14 NOTE — Telephone Encounter (Signed)
Patient given detailed instructions per Myocardial Perfusion Study Information Sheet for the test on 01/16/15 at 0730. Patient notified to arrive 15 minutes early and that it is imperative to arrive on time for appointment to keep from having the test rescheduled.  If you need to cancel or reschedule your appointment, please call the office within 24 hours of your appointment. Failure to do so may result in a cancellation of your appointment, and a $50 no show fee. Patient verbalized understanding.Luken Shadowens, Ranae Palms

## 2015-01-16 ENCOUNTER — Ambulatory Visit (HOSPITAL_BASED_OUTPATIENT_CLINIC_OR_DEPARTMENT_OTHER): Payer: Medicare Other

## 2015-01-16 ENCOUNTER — Ambulatory Visit (HOSPITAL_COMMUNITY)
Admission: RE | Admit: 2015-01-16 | Discharge: 2015-01-16 | Disposition: A | Discharge status: Home / Self Care | Payer: Medicare Other | Source: Ambulatory Visit | Attending: Cardiology | Admitting: Cardiology

## 2015-01-16 DIAGNOSIS — R0602 Shortness of breath: Secondary | ICD-10-CM | POA: Insufficient documentation

## 2015-01-16 DIAGNOSIS — I779 Disorder of arteries and arterioles, unspecified: Secondary | ICD-10-CM | POA: Diagnosis not present

## 2015-01-16 DIAGNOSIS — R002 Palpitations: Secondary | ICD-10-CM | POA: Insufficient documentation

## 2015-01-16 DIAGNOSIS — E785 Hyperlipidemia, unspecified: Secondary | ICD-10-CM | POA: Diagnosis not present

## 2015-01-16 DIAGNOSIS — I6529 Occlusion and stenosis of unspecified carotid artery: Secondary | ICD-10-CM

## 2015-01-16 DIAGNOSIS — R079 Chest pain, unspecified: Secondary | ICD-10-CM | POA: Diagnosis not present

## 2015-01-16 DIAGNOSIS — I10 Essential (primary) hypertension: Secondary | ICD-10-CM | POA: Insufficient documentation

## 2015-01-16 DIAGNOSIS — I6523 Occlusion and stenosis of bilateral carotid arteries: Secondary | ICD-10-CM | POA: Insufficient documentation

## 2015-01-16 DIAGNOSIS — R0609 Other forms of dyspnea: Secondary | ICD-10-CM | POA: Insufficient documentation

## 2015-01-16 LAB — MYOCARDIAL PERFUSION IMAGING
CHL CUP RESTING HR STRESS: 89 {beats}/min
LV sys vol: 31 mL
LVDIAVOL: 61 mL
NUC STRESS TID: 0.77
Peak HR: 108 {beats}/min
RATE: 0.4
SDS: 0
SRS: 6
SSS: 6

## 2015-01-16 MED ORDER — TECHNETIUM TC 99M SESTAMIBI GENERIC - CARDIOLITE
10.3000 | Freq: Once | INTRAVENOUS | Status: AC | PRN
  Administered 2015-01-16: 10 via INTRAVENOUS

## 2015-01-16 MED ORDER — REGADENOSON 0.4 MG/5ML IV SOLN
0.4000 mg | Freq: Once | INTRAVENOUS | Status: AC
  Administered 2015-01-16: 0.4 mg via INTRAVENOUS

## 2015-01-16 MED ORDER — TECHNETIUM TC 99M SESTAMIBI GENERIC - CARDIOLITE
33.0000 | Freq: Once | INTRAVENOUS | Status: AC | PRN
  Administered 2015-01-16: 33 via INTRAVENOUS

## 2015-01-17 ENCOUNTER — Telehealth: Payer: Self-pay | Admitting: Cardiovascular Disease

## 2015-01-17 NOTE — Telephone Encounter (Signed)
PT  AWARE OF  TEST  RESULTS  ./CY 

## 2015-01-17 NOTE — Telephone Encounter (Signed)
New problem   Pt returning your call  And will be home til 1:50 and then she will be back at 3:30. Please call pt.

## 2016-03-04 NOTE — Progress Notes (Signed)
Patient ID: Grace Mccann, female   DOB: 1940-03-20, 76 y.o.   MRN: [id number]     Cardiology Office Note   Date:  03/08/2016   ID:  Grace Mccann, DOB 1940-03-10, MRN [id number]  PCP:  Sherrie Mustache, MD  Cardiologist:   Jenkins Rouge, MD   Chief Complaint  Patient presents with  . Carotid Stenosis      History of Present Illness: Grace Mccann is a 76 y.o. female initially seen 12/2014  evaluation of chest pain:  CRF;s HTN, elevated lipids.  History of syncope 2011 One report in Epic of [id number] LICA stenosis.  Chest discomfort related to food not helped with protonix.  Occasional dyspnea  Pressure something stuck in her mid chest.  No radiation or arm or neck  Still with reflux symptoms When eats at [employer] or lays flat at night.  Barrium swallow ordered by primary Dysmotility study was normal Stopped her welchol but this has not helped much She is sedentary.  Hard to walk due to arthritis.    Reviewed myovue done 01/16/15 normal no ischemia or infarction EF 50%  Duplex same day plaque no stenosis of ICA;s    Past Medical History:  Diagnosis Date  . Anxiety   . Arthritis   . Gastric polyps   . GERD (gastroesophageal reflux disease)   . Hx of adenomatous colonic polyps 06/13/2014  . Hyperlipidemia   . Hypertension     Past Surgical History:  Procedure Laterality Date  . CHOLECYSTECTOMY    . COLONOSCOPY    . ESOPHAGOGASTRODUODENOSCOPY N/A 09/20/2012   Procedure: ESOPHAGOGASTRODUODENOSCOPY (EGD);  Surgeon: Gatha Mayer, MD;  Location: [facility];  Service: Endoscopy;  Laterality: N/A;  . TOTAL ABDOMINAL HYSTERECTOMY    . UPPER GASTROINTESTINAL ENDOSCOPY       Current Outpatient Prescriptions  Medication Sig Dispense Refill  . acetaminophen (TYLENOL) 650 MG CR tablet Take 650 mg by mouth every 8 (eight) hours as needed. (PAIN)    . aspirin 81 MG tablet Take 1 tablet (81 mg total) by mouth daily. 30 tablet   . Coenzyme Q10 (COQ10 PO) Take 1 capsule by  mouth daily.    Marland Kitchen EXFORGE HCT 10-160-25 MG TABS Take 1 tablet by mouth Daily.    . fluticasone (FLONASE) 50 MCG/ACT nasal spray Place 1 spray into both nostrils daily as needed for allergies or rhinitis.    Marland Kitchen LORazepam (ATIVAN) 0.5 MG tablet Take 0.5 mg by mouth every 6 (six) hours as needed. (ANXIETY)    . Probiotic Product (ALIGN PO) Take 1 tablet by mouth daily.    Marland Kitchen ZETIA 10 MG tablet Take 1 tablet by mouth Daily.    . pantoprazole (PROTONIX) 40 MG tablet Take 40 mg by mouth daily.     No current facility-administered medications for this visit.     Allergies:   Niacin    Social History:  The patient  reports that she has never smoked. She has never used smokeless tobacco. She reports that she does not drink alcohol or use drugs.   Family History:  The patient's family history includes Alzheimer's disease in her father; Breast cancer in her mother.    ROS:  Please see the history of present illness.   Otherwise, review of systems are positive for none.   All other systems are reviewed and negative.    PHYSICAL EXAM: VS:  BP 120/80   Pulse 86   Ht 5\' 6"  (1.676 m)   Wt 222  lb 1.9 oz (100.8 kg)   SpO2 94%   BMI 35.85 kg/m  , BMI Body mass index is 35.85 kg/m. Affect appropriate Healthy:  appears stated age 24: normal Neck supple with no adenopathy JVP normal faint left  bruits no thyromegaly Lungs clear with no wheezing and good diaphragmatic motion Heart:  S1/S2 no murmur, no rub, gallop or click PMI normal Abdomen: benighn, BS positve, no tenderness, no AAA no bruit.  No HSM or HJR Distal pulses intact with no bruits No edema Neuro non-focal Skin warm and dry No muscular weakness    EKG:  SR low voltage rate 88 PAC nonspecific ST changes 12/2014  03/08/16 SR rate 76 PVC otherwise normal    Recent Labs: No results found for requested labs within last 8760 hours.    Lipid Panel    Component Value Date/Time   CHOL  05/26/2009 0515    174        ATP III  CLASSIFICATION:  <200     mg/dL   Desirable  200-239  mg/dL   Borderline High  >=240    mg/dL   High          TRIG 177 (H) 05/26/2009 0515   HDL 50 05/26/2009 0515   CHOLHDL 3.5 05/26/2009 0515   VLDL 35 05/26/2009 0515   LDLCALC  05/26/2009 0515    89        Total Cholesterol/HDL:CHD Risk Coronary Heart Disease Risk Table                     Men   Women  1/2 Average Risk   3.4   3.3  Average Risk       5.0   4.4  2 X Average Risk   9.6   7.1  3 X Average Risk  23.4   11.0        Use the calculated Patient Ratio above and the CHD Risk Table to determine the patient's CHD Risk.        ATP III CLASSIFICATION (LDL):  <100     mg/dL   Optimal  100-129  mg/dL   Near or Above                    Optimal  130-159  mg/dL   Borderline  160-189  mg/dL   High  >190     mg/dL   Very High      Wt Readings from Last 3 Encounters:  03/08/16 222 lb 1.9 oz (100.8 kg)  01/16/15 216 lb (98 kg)  01/07/15 216 lb 6.4 oz (98.2 kg)      Other studies Reviewed: Additional studies/ records that were reviewed today include:  Epic notes and records from referring doctor [facility] .    ASSESSMENT AND PLAN:  1.  Chest Pain:  Normal myovue 12/2014 GI overtones  2. HTN: Well controlled.  Continue current medications and low sodium Dash type diet.   3. Chol  On zetia intolerant to niacin and statins f/u lipid clinic  4. Carotid: plaque no stenosis continue risk factor modification and baby aspirin     Current medicines are reviewed at length with the patient today.  The patient does not have concerns regarding medicines.  The following changes have been made:  no change  Labs/ tests ordered today include:  None   Orders Placed This Encounter  Procedures  . EKG 12-Lead     Disposition:   FU  with me in a year     Signed, Jenkins Rouge, MD  03/08/2016 10:18 AM    [address], [place], [place]  [address] Phone: [phone]; Fax: [phone]

## 2016-03-08 ENCOUNTER — Encounter: Payer: Self-pay | Admitting: Cardiovascular Disease

## 2016-03-08 ENCOUNTER — Ambulatory Visit (INDEPENDENT_AMBULATORY_CARE_PROVIDER_SITE_OTHER): Payer: Medicare Other | Admitting: Cardiovascular Disease

## 2016-03-08 VITALS — BP 120/80 | HR 86 | Ht 66.0 in | Wt 222.1 lb

## 2016-03-08 DIAGNOSIS — I6529 Occlusion and stenosis of unspecified carotid artery: Secondary | ICD-10-CM

## 2016-03-08 NOTE — Patient Instructions (Addendum)
Medication Instructions:  Your physician recommends that you continue on your current medications as directed. Please refer to the Current Medication list given to you today.  Labwork: NONE  Testing/Procedures: NONE  Follow-Up: Your physician wants you to follow-up in: 12 months with Dr. Johnsie Cancel. You will receive a reminder letter in the mail two months in advance. If you don't receive a letter, please call our office to schedule the follow-up appointment.  Your physician recommends that you schedule a follow-up appointment with the lipid clinic.   If you need a refill on your cardiac medications before your next appointment, please call your pharmacy.

## 2016-03-16 ENCOUNTER — Ambulatory Visit (INDEPENDENT_AMBULATORY_CARE_PROVIDER_SITE_OTHER): Payer: Medicare Other | Admitting: Pharmacist

## 2016-03-16 DIAGNOSIS — E785 Hyperlipidemia, unspecified: Secondary | ICD-10-CM | POA: Insufficient documentation

## 2016-03-16 MED ORDER — ROSUVASTATIN CALCIUM 5 MG PO TABS: 5.0000 mg | ORAL_TABLET | Freq: Every day | ORAL | 11 refills | Status: DC

## 2016-03-16 NOTE — Patient Instructions (Addendum)
Start taking Crestor (rosuvastatin) 5mg  daily for your cholesterol.  Continue taking your Zetia.  Try to limit red meat (bacon, sausage, and cheeseburgers) - look for grilled chicken options at restaurants.  Have your cholesterol checked at office visit with Dr Edrick Oh in April.  Call [name] in lipid clinic with any questions [phone].

## 2016-03-16 NOTE — Progress Notes (Signed)
Patient ID: BELLISSA TSCHIDA                 DOB: 1940-08-17                    MRN: [id number]     HPI: Grace Mccann is a 76 y.o. female patient referred to lipid clinic by Dr Johnsie Cancel. PMH is significant for carotid stenosis with 40% left ICA stenosis in 2011, chest pain, HTN, HLD, GERD, and obesity. Patient has a history of statin intolerance and presents to lipid clinic for further management.  No recent labs in our system - pt brought labwork from PCP Dr Dione Housekeeper. Her cholesterol was checked on 01/28/16 and LDL was elevated at 148mg /dL. She has been taking Zetia without problem. Recalls previously trying Lipitor and Crestor. She took Lipitor for 10-15 years before developing arm and shoulder myalgias. She has also tried Crestor which she tolerated a little better. Does not recall doses on either of these medications and thinks she tried a few other statins as well.  Pt does not exercise at all and is sedentary. She eats a lot of fast food and never cooks at home since she is on the move with her grand kids. Eats a lot of bacon, sausage, and burgers.  Current Medications: Zetia 10mg  daily, CoQ10 - unsure of strength Intolerances: niacin - flushing, Crestor and Lipitor - myalgias, Welchol - unknown  Risk Factors: carotid stenosis with 40% left ICA stenosis, HTN, obesity LDL goal: 70mg /dL  Diet: Eats out frequently, Bacon or sausage and eggs for breakfast. 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 Goes to [place], [employer], [employer],   Exercise: None. States she's lazy. Has knee pain.  Family History: The patient's family history includes Alzheimer's disease in her father; Breast cancer in her mother.   Social History: The patient  reports that she has never smoked. She has never used smokeless tobacco. She reports that she does not drink alcohol or use drugs.   Labs: 01/28/16: TC 245, TG 229, HDL 51, LDL 148 (on Zetia 10mg  daily)  Past Medical History:    Diagnosis Date  . Anxiety   . Arthritis   . Gastric polyps   . GERD (gastroesophageal reflux disease)   . Hx of adenomatous colonic polyps 06/13/2014  . Hyperlipidemia   . Hypertension     Current Outpatient Prescriptions on File Prior to Visit  Medication Sig Dispense Refill  . acetaminophen (TYLENOL) 650 MG CR tablet Take 650 mg by mouth every 8 (eight) hours as needed. (PAIN)    . aspirin 81 MG tablet Take 1 tablet (81 mg total) by mouth daily. 30 tablet   . Coenzyme Q10 (COQ10 PO) Take 1 capsule by mouth daily.    Marland Kitchen EXFORGE HCT 10-160-25 MG TABS Take 1 tablet by mouth Daily.    . fluticasone (FLONASE) 50 MCG/ACT nasal spray Place 1 spray into both nostrils daily as needed for allergies or rhinitis.    Marland Kitchen LORazepam (ATIVAN) 0.5 MG tablet Take 0.5 mg by mouth every 6 (six) hours as needed. (ANXIETY)    . pantoprazole (PROTONIX) 40 MG tablet Take 40 mg by mouth daily.    . Probiotic Product (ALIGN PO) Take 1 tablet by mouth daily.    Marland Kitchen ZETIA 10 MG tablet Take 1 tablet by mouth Daily.     No current facility-administered medications on file prior to visit.     Allergies  Allergen  Reactions  . Niacin Other (See Comments)    "FLUSHED"    Assessment/Plan:  1. Hyperlipidemia - LDL 148mg /dL above goal 70mg /dL given history of 40% left ICA stenosis. Pt is previously intolerant to unknown doses of Crestor and Lipitor but is willing to rechallenge with low dose Crestor 5mg  daily. She will continue Zetia 10mg  daily. LDL also elevated due to dietary indiscretion - pt will work to cut back on sausage, bacon, and burgers. Will have lipids rechecked in 3 months with PCP.  Pt signed informed consent for [facility] lipid registry.   Megan E. Supple, PharmD, CPP, [place] [id number] [address] [place], [place] [address] Phone: [phone]; Fax: [phone] 03/16/2016 10:13 AM

## 2016-03-19 ENCOUNTER — Encounter: Payer: Self-pay | Admitting: *Deleted

## 2016-03-19 DIAGNOSIS — Z006 Encounter for examination for normal comparison and control in clinical research program: Secondary | ICD-10-CM

## 2016-03-19 NOTE — Progress Notes (Signed)
Late entry:  Subject met inclusion and exclusion criteria. The informed consent form, study requirements and expectations were reviewed with the subject and questions and concerns were addressed prior to the signing of the consent form. The subject verbalized understanding of the trail requirements. The subject agreed to participate in the [place] Registryand signed the informed consent. The informed consent was obtained prior to performance of any protocol-specific procedures for the subject. A copy of the signed informed consent was given to the subject and a copy was placed in the subject's medical record.  Jake Bathe, RN 03/16/2016 0930 am

## 2016-03-24 ENCOUNTER — Encounter: Payer: Self-pay | Admitting: Cardiology

## 2016-06-21 ENCOUNTER — Telehealth: Payer: Self-pay | Admitting: Pharmacist

## 2016-06-21 NOTE — Telephone Encounter (Signed)
Called pt to see if she had f/u lipid panel drawn with PCP Dr Edrick Oh. Pt states she did a month ago. Lipid panel results as follows:  TC 249, TG 287, HDL 49, LDL 143 (Zetia 10mg  daily)  She has stopped taking her Crestor 5mg  daily because it started causing myalgias. She was previously intolerant to unknown doses of Crestor and Lipitor. She has remained adherent with her Zetia. Pt states she does not want to come in for further cholesterol management and would like to wait and see how her next lipid panel looks (reports her PCP checks a lipid panel every month). Updated medication profile and allergy list.

## 2017-03-23 IMAGING — NM NM MYOCAR MULTI W/ SPECT
3 series · 18 of 18 positions shown · non-contrast
Comparison: none

[Series 1: rest · 6.51mm/px · 6 of 64 frames shown]
[frame 6/64]
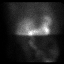
[frame 16/64]
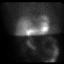
[frame 27/64]
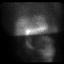
[frame 38/64]
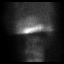
[frame 48/64]
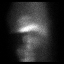
[frame 59/64]
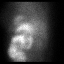

[Series 2: stress · 6.51mm/px · 6 of 512 frames shown (1 of 2)]
[frame 43/512]
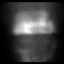
[frame 128/512]
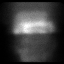
[frame 214/512]
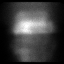
[frame 299/512]
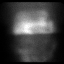
[frame 384/512]
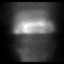
[frame 470/512]
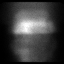

[Series 2: stress · 6.51mm/px · 6 of 64 frames shown (2 of 2)]
[frame 6/64]
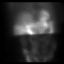
[frame 16/64]
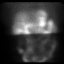
[frame 27/64]
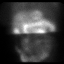
[frame 38/64]
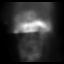
[frame 48/64]
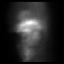
[frame 59/64]
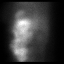

[18 of 18 positions shown; findings below may reference images not displayed]

Canned report from images found in remote index.

Refer to host system for actual result text.

## 2017-05-30 ENCOUNTER — Encounter: Payer: Self-pay | Admitting: Internal Medicine

## 2017-09-12 ENCOUNTER — Encounter: Payer: Self-pay | Admitting: Internal Medicine

## 2017-09-12 ENCOUNTER — Ambulatory Visit: Payer: Medicare Other | Admitting: Internal Medicine

## 2017-09-12 VITALS — BP 122/72 | HR 72 | Ht 65.0 in | Wt 222.2 lb

## 2017-09-12 DIAGNOSIS — Z86010 Personal history of colonic polyps: Secondary | ICD-10-CM | POA: Diagnosis not present

## 2017-09-12 DIAGNOSIS — K317 Polyp of stomach and duodenum: Secondary | ICD-10-CM | POA: Diagnosis not present

## 2017-09-12 NOTE — Progress Notes (Signed)
Grace Mccann 77 y.o. 1940/07/17 [id number]  Assessment & Plan:   Encounter Diagnoses  Name Primary?  Marland Kitchen Hx of adenomatous colonic polyps Yes  . Gastric polyps    Given the number of adenomatous polyps she had last time and her history of hyperplastic gastric polyps despite her elderly age I think she is fit enough and is appropriate to repeat surveillance exams with a EGD and colonoscopy.  After this explanation she agrees to schedule.  The risks and benefits as well as alternatives of endoscopic procedure(s) have been discussed and reviewed. All questions answered. The patient agrees to proceed.  I appreciate the opportunity to care for this patient. CC: Dione Housekeeper, MD    Subjective:   Chief Complaint: History of colon and stomach polyps  HPI Grace Mccann is here today for follow-up, she has occasional loose stools and one time since her EGD and colonoscopy 3 years ago she had an episode of dysphagia.  Otherwise she has done reasonably well other than aches and pains in her hips and knees and lower joints.  In April 2016 a colonoscopy demonstrated 8 adenomas the largest 15 to 20 mm.  She also had hyperplastic gastric polyps as well as fundic gland polyps.  2 hyperplastic polyps that were biopsied.  I had planned on 3-year repeat for both procedures. Allergies  Allergen Reactions  . Crestor [Rosuvastatin Calcium]     Myalgias even with 5mg  daily dosing  . Lipitor [Atorvastatin]     Myalgias  . Niacin Other (See Comments)    "FLUSHED"   Current Meds  Medication Sig  . acetaminophen (TYLENOL) 650 MG CR tablet Take 650 mg by mouth as needed. (PAIN)  . aspirin 81 MG tablet Take 1 tablet (81 mg total) by mouth daily.  . Coenzyme Q10 (COQ10 PO) Take 1 capsule by mouth daily.  Marland Kitchen EXFORGE HCT 10-160-25 MG TABS Take 1 tablet by mouth Daily.  Marland Kitchen LORazepam (ATIVAN) 0.5 MG tablet Take 0.5 mg by mouth 2 (two) times daily. (ANXIETY)  . pantoprazole (PROTONIX) 40 MG tablet Take 40 mg by  mouth daily.  . Probiotic Product (ALIGN PO) Take 1 tablet by mouth as needed.    Past Medical History:  Diagnosis Date  . Anxiety   . Arthritis   . Gastric polyps   . GERD (gastroesophageal reflux disease)   . Hx of adenomatous colonic polyps 06/13/2014  . Hyperlipidemia   . Hypertension    Past Surgical History:  Procedure Laterality Date  . CHOLECYSTECTOMY    . COLONOSCOPY    . ESOPHAGOGASTRODUODENOSCOPY N/A 09/20/2012   Procedure: ESOPHAGOGASTRODUODENOSCOPY (EGD);  Surgeon: Gatha Mayer, MD;  Location: [facility];  Service: Endoscopy;  Laterality: N/A;  . TOTAL ABDOMINAL HYSTERECTOMY    . UPPER GASTROINTESTINAL ENDOSCOPY     Social History   Social History Narrative   Married - lives w/ husband   Retired school bus driver   3 kids 3 grandkids - live nearby   No alcohol tobacco or drug use   family history includes Alzheimer's disease in her father; Breast cancer in her mother.   Review of Systems As per HPI Also has occasional left arm numbness Has difficulty walking more than 200 feet due to leg sxs Objective:   Physical Exam @BP  122/72 (BP Location: Left Arm, Patient Position: Sitting, Cuff Size: Normal)   Pulse 72 Comment: irregular  Ht 5\' 5"  (1.651 m) Comment: height measured without shoes  Wt 222 lb 4 oz (100.8 kg)  BMI 36.98 kg/m @  General:  NAD obese pleasant elderly white woman Eyes:   anicteric Lungs:  clear Heart::  S1S2 no rubs, murmurs or gallops Abdomen:  soft and nontender, BS+ Psych/Neuro: Alert and oriented and appropriate    Data Reviewed:  See HPI I did review her June 01, 2017 primary care note from Dr. Edrick Oh  Normal CBC May 27, 2017 Glucose 110 otherwise normal CMET same date Hemoglobin A1c 6.2%

## 2017-09-12 NOTE — Patient Instructions (Signed)
You have been scheduled for an endoscopy and colonoscopy. Please follow the written instructions given to you at your visit today. Please pick up your prep supplies at the pharmacy. If you use inhalers (even only as needed), please bring them with you on the day of your procedure.   I appreciate the opportunity to care for you. Carl Gessner, MD, FACG 

## 2017-10-27 ENCOUNTER — Encounter: Payer: Self-pay | Admitting: Internal Medicine

## 2017-11-08 ENCOUNTER — Ambulatory Visit (AMBULATORY_SURGERY_CENTER): Payer: Medicare Other | Admitting: Internal Medicine

## 2017-11-08 ENCOUNTER — Encounter: Payer: Self-pay | Admitting: Internal Medicine

## 2017-11-08 VITALS — BP 173/89 | HR 88 | Temp 98.6°F | Resp 18 | Ht 65.0 in | Wt 222.0 lb

## 2017-11-08 DIAGNOSIS — D125 Benign neoplasm of sigmoid colon: Secondary | ICD-10-CM | POA: Diagnosis not present

## 2017-11-08 DIAGNOSIS — Z86010 Personal history of colonic polyps: Secondary | ICD-10-CM | POA: Diagnosis present

## 2017-11-08 DIAGNOSIS — D120 Benign neoplasm of cecum: Secondary | ICD-10-CM

## 2017-11-08 DIAGNOSIS — D124 Benign neoplasm of descending colon: Secondary | ICD-10-CM

## 2017-11-08 DIAGNOSIS — D123 Benign neoplasm of transverse colon: Secondary | ICD-10-CM | POA: Diagnosis not present

## 2017-11-08 DIAGNOSIS — K317 Polyp of stomach and duodenum: Secondary | ICD-10-CM

## 2017-11-08 DIAGNOSIS — K635 Polyp of colon: Secondary | ICD-10-CM

## 2017-11-08 MED ORDER — SODIUM CHLORIDE 0.9 % IV SOLN: 500.0000 mL | Freq: Once | INTRAVENOUS | Status: DC

## 2017-11-08 MED ORDER — ASPIRIN 81 MG PO TABS: 81.0000 mg | ORAL_TABLET | Freq: Every day | ORAL | Status: AC

## 2017-11-08 NOTE — Progress Notes (Signed)
Called to room to assist during endoscopic procedure.  Patient ID and intended procedure confirmed with present staff. Received instructions for my participation in the procedure from the performing physician.  

## 2017-11-08 NOTE — Patient Instructions (Addendum)
I found and removed one most of one stomach polyp - and 6 colon polyps.  Nothing looks like cancer.  Not sure I would make you repeat these tests given your age but let me look at the results and will let you know.  I appreciate the opportunity to care for you. Gatha Mayer, MD, FACG  YOU HAD AN ENDOSCOPIC PROCEDURE TODAY AT [facility]:   Refer to the procedure report that was given to you for any specific questions about what was found during the examination.  If the procedure report does not answer your questions, please call your gastroenterologist to clarify.  If you requested that your care partner not be given the details of your procedure findings, then the procedure report has been included in a sealed envelope for you to review at your convenience later.  YOU SHOULD EXPECT: Some feelings of bloating in the abdomen. Passage of more gas than usual.  Walking can help get rid of the air that was put into your GI tract during the procedure and reduce the bloating. If you had a lower endoscopy (such as a colonoscopy or flexible sigmoidoscopy) you may notice spotting of blood in your stool or on the toilet paper. If you underwent a bowel prep for your procedure, you may not have a normal bowel movement for a few days.  Please Note:  You might notice some irritation and congestion in your nose or some drainage.  This is from the oxygen used during your procedure.  There is no need for concern and it should clear up in a day or so.  SYMPTOMS TO REPORT IMMEDIATELY:   Following lower endoscopy (colonoscopy or flexible sigmoidoscopy):  Excessive amounts of blood in the stool  Significant tenderness or worsening of abdominal pains  Swelling of the abdomen that is new, acute  Fever of 100F or higher   Following upper endoscopy (EGD)  Vomiting of blood or coffee ground material  New chest pain or pain under the shoulder blades  Painful or persistently difficult  swallowing  New shortness of breath  Fever of 100F or higher  Black, tarry-looking stools  For urgent or emergent issues, a gastroenterologist can be reached at any hour by calling [phone].   DIET:  We do recommend a small meal at first, but then you may proceed to your regular diet.  Drink plenty of fluids but you should avoid alcoholic beverages for 24 hours.  ACTIVITY:  You should plan to take it easy for the rest of today and you should NOT DRIVE or use heavy machinery until tomorrow (because of the sedation medicines used during the test).    FOLLOW UP: Our staff will call the number listed on your records the next business day following your procedure to check on you and address any questions or concerns that you may have regarding the information given to you following your procedure. If we do not reach you, we will leave a message.  However, if you are feeling well and you are not experiencing any problems, there is no need to return our call.  We will assume that you have returned to your regular daily activities without incident.  If any biopsies were taken you will be contacted by phone or by letter within the next 1-3 weeks.  Please call us at [phone] if you have not heard about the biopsies in 3 weeks.    SIGNATURES/CONFIDENTIALITY: You and/or your care partner have signed  paperwork which will be entered into your electronic medical record.  These signatures attest to the fact that that the information above on your After Visit Summary has been reviewed and is understood.  Full responsibility of the confidentiality of this discharge information lies with you and/or your care-partner.

## 2017-11-08 NOTE — Progress Notes (Signed)
No egg or soy allergy 

## 2017-11-08 NOTE — Progress Notes (Signed)
Report given to PACU, vss 

## 2017-11-08 NOTE — Op Note (Signed)
[facility] Patient Name: Grace Mccann Procedure Date: 11/08/2017 2:24 PM MRN: [id number] Endoscopist: Gatha Mayer , MD Age: 77 Referring MD:  Date of Birth: Jun 23, 1940 Gender: Female Account #: [id number] Procedure:                Colonoscopy Indications:              High risk colon cancer surveillance: Personal                            history of colonic polyps Medicines:                Propofol per Anesthesia, Monitored Anesthesia Care Procedure:                Pre-Anesthesia Assessment:                           - Prior to the procedure, a History and Physical                            was performed, and patient medications and                            allergies were reviewed. The patient's tolerance of                            previous anesthesia was also reviewed. The risks                            and benefits of the procedure and the sedation                            options and risks were discussed with the patient.                            All questions were answered, and informed consent                            was obtained. Prior Anticoagulants: The patient                            last took aspirin 1 day prior to the procedure. ASA                            Grade Assessment: II - A patient with mild systemic                            disease. After reviewing the risks and benefits,                            the patient was deemed in satisfactory condition to                            undergo the procedure.                           -  Prior to the procedure, a History and Physical                            was performed, and patient medications and                            allergies were reviewed. The patient's tolerance of                            previous anesthesia was also reviewed. The risks                            and benefits of the procedure and the sedation                            options and risks were discussed with  the patient.                            All questions were answered, and informed consent                            was obtained. Prior Anticoagulants: The patient                            last took aspirin 1 day prior to the procedure. ASA                            Grade Assessment: II - A patient with mild systemic                            disease. After reviewing the risks and benefits,                            the patient was deemed in satisfactory condition to                            undergo the procedure.                           After obtaining informed consent, the colonoscope                            was passed under direct vision. Throughout the                            procedure, the patient's blood pressure, pulse, and                            oxygen saturations were monitored continuously. The                            Model PCF-[id number]) scope was introduced  through the anus and advanced to the the cecum,                            identified by appendiceal orifice and ileocecal                            valve. The colonoscopy was somewhat difficult due                            to significant looping. Successful completion of                            the procedure was aided by applying abdominal                            pressure. The patient tolerated the procedure well.                            The quality of the bowel preparation was good. The                            ileocecal valve, appendiceal orifice, and rectum                            were photographed. The bowel preparation used was                            Miralax. Scope In: 2:52:12 PM Scope Out: 3:17:54 PM Scope Withdrawal Time: 0 hours 17 minutes 21 seconds  Total Procedure Duration: 0 hours 25 minutes 42 seconds  Findings:                 The perianal and digital rectal examinations were                            normal.                            Four sessile polyps were found in the sigmoid                            colon, transverse colon and cecum. The polyps were                            diminutive in size. These polyps were removed with                            a cold snare. Resection and retrieval were                            complete. Verification of patient identification                            for the specimen was done. Estimated blood loss was  minimal.                           A 8 mm polyp was found in the descending colon. The                            polyp was semi-pedunculated. The polyp was removed                            with a hot snare. Resection and retrieval were                            complete. Verification of patient identification                            for the specimen was done. Estimated blood loss:                            none.                           A 1 to 2 mm polyp was found in the sigmoid colon.                            The polyp was sessile. The polyp was removed with a                            cold biopsy forceps. Resection and retrieval were                            complete. Verification of patient identification                            for the specimen was done. Estimated blood loss was                            minimal.                           Diverticula were found in the sigmoid colon.                           The exam was otherwise without abnormality on                            direct and retroflexion views. Complications:            No immediate complications. Estimated Blood Loss:     Estimated blood loss was minimal. Impression:               - Four diminutive polyps in the sigmoid colon, in                            the transverse colon and in the cecum, removed with  a cold snare. Resected and retrieved.                           - One 8 mm polyp in the descending colon, removed                             with a hot snare. Resected and retrieved.                           - One 1 to 2 mm polyp in the sigmoid colon, removed                            with a cold biopsy forceps. Resected and retrieved.                           - Diverticulosis in the sigmoid colon.                           - The examination was otherwise normal on direct                            and retroflexion views.                           - Personal history of colonic polyps. Recommendation:           - Patient has a contact number available for                            emergencies. The signs and symptoms of potential                            delayed complications were discussed with the                            patient. Return to normal activities tomorrow.                            Written discharge instructions were provided to the                            patient.                           - Resume previous diet.                           - Continue present medications.                           - No aspirin, ibuprofen, naproxen, or other                            non-steroidal anti-inflammatory drugs for 2 weeks  after polyp removal.                           - Repeat colonoscopy may be recommended for                            surveillance. The possible colonoscopy date will be                            determined after pathology results from today's                            exam become available for review. Gatha Mayer, MD 11/08/2017 3:37:46 PM This report has been signed electronically.

## 2017-11-08 NOTE — Op Note (Signed)
[facility] Patient Name: Grace Mccann Procedure Date: 11/08/2017 2:24 PM MRN: [id number] Endoscopist: Gatha Mayer , MD Age: 77 Referring MD:  Date of Birth: [birth year] Gender: Female Account #: [id number] Procedure:                Upper GI endoscopy Indications:              Gastric polyps, Follow-up of gastric polyps, For                            therapy of gastric polyps Medicines:                Propofol per Anesthesia, Monitored Anesthesia Care Procedure:                Pre-Anesthesia Assessment:                           - Prior to the procedure, a History and Physical                            was performed, and patient medications and                            allergies were reviewed. The patient's tolerance of                            previous anesthesia was also reviewed. The risks                            and benefits of the procedure and the sedation                            options and risks were discussed with the patient.                            All questions were answered, and informed consent                            was obtained. Prior Anticoagulants: The patient                            last took aspirin 1 day prior to the procedure. ASA                            Grade Assessment: II - A patient with mild systemic                            disease. After reviewing the risks and benefits,                            the patient was deemed in satisfactory condition to                            undergo the procedure.  After obtaining informed consent, the endoscope was                            passed under direct vision. Throughout the                            procedure, the patient's blood pressure, pulse, and                            oxygen saturations were monitored continuously. The                            Endoscope was introduced through the mouth, and                            advanced to the  second part of duodenum. The upper                            GI endoscopy was accomplished without difficulty. Scope In: Scope Out: Findings:                 A single 10 mm semi-sessile polyp with no bleeding                            and stigmata of recent bleeding was found in the                            cardia. The polyp was removed with a hot snare                            given mildly hemorrhagic appearance of surface and                            to reduce future blood loss risk. Polyp resection                            was incomplete. The resected tissue was retrieved.                            Verification of patient identification for the                            specimen was done. Estimated blood loss was                            minimal. To prevent bleeding after the polypectomy,                            three hemostatic clips were successfully placed (MR                            conditional). There was no bleeding during, or at  the end, of the procedure. Estimated blood loss was                            minimal.                           Too numerous to count 4 to 15 mm sessile polyps                            with no bleeding and no stigmata of recent bleeding                            were found in the entire examined stomach.                           The exam was otherwise without abnormality.                           The cardia and gastric fundus were normal on                            retroflexion. Complications:            No immediate complications. Estimated Blood Loss:     Estimated blood loss was minimal. Impression:               - A single gastric polyp. Incomplete resection.                            Resected tissue retrieved. Clips (MR conditional)                            were placed.                           - Too numerous to count gastric polyps otherwise -                            almost all look  like known fundic gland polyps -                            with a few erythematous looking like known (small)                            hyperplastic polyps                           - The examination was otherwise normal. Recommendation:           - Patient has a contact number available for                            emergencies. The signs and symptoms of potential                            delayed complications were discussed  with the                            patient. Return to normal activities tomorrow.                            Written discharge instructions were provided to the                            patient.                           - No aspirin, ibuprofen, naproxen, or other                            non-steroidal anti-inflammatory drugs for 2 weeks                            after polyp removal.                           - Await pathology results.                           - See the other procedure note for documentation of                            additional recommendations. Gatha Mayer, MD 11/08/2017 3:33:17 PM This report has been signed electronically.

## 2017-11-09 ENCOUNTER — Telehealth: Payer: Self-pay | Admitting: *Deleted

## 2017-11-09 NOTE — Telephone Encounter (Signed)
  Follow up Call-  Call back number 11/08/2017  Post procedure Call Back phone  # [phone]  Permission to leave phone message Yes  Some recent data might be hidden     Patient questions:  Do you have a fever, pain , or abdominal swelling? No. Pain Score  0 *  Have you tolerated food without any problems? Yes.    Have you been able to return to your normal activities? Yes.    Do you have any questions about your discharge instructions: Diet   No. Medications  No. Follow up visit  No.  Do you have questions or concerns about your Care? No.  Actions: * If pain score is 4 or above: No action needed, pain <4.

## 2017-11-21 ENCOUNTER — Encounter: Payer: Self-pay | Admitting: Internal Medicine

## 2017-11-21 DIAGNOSIS — K317 Polyp of stomach and duodenum: Secondary | ICD-10-CM

## 2017-11-21 DIAGNOSIS — Z86010 Personal history of colonic polyps: Secondary | ICD-10-CM

## 2017-11-21 NOTE — Progress Notes (Signed)
6 colon adenomas 1 gastric hyperplastic polyp  Recall OV 1 year Consider repeating colon/EGD at some point

## 2018-03-21 NOTE — Progress Notes (Signed)
Patient ID: Jones Broom, female   DOB: 15-Apr-1940, 78 y.o.   MRN: [id number]     Cardiology Office Note   Date:  03/24/2018   ID:  LANAI CONLEE, DOB January 01, 1941, MRN [id number]  PCP:  Dione Housekeeper, MD  Cardiologist:   Jenkins Rouge, MD   No chief complaint on file.     History of Present Illness:  78 y.o. f/u HLD and history of chest pain. First seen 12/2014.  Atypical chest pain with GI overtones Myovue done 01/16/15 normal  Duplex carotids plaque no stenosis same day. She has been intolerant to statins including crestor and lipitor and did not want to f/u in lipid clinic Taking zetia now [employer] for GI with history of polyps   She seems to have arthritis in her knees which is the cause of her leg pain not cholesterol meds She normally takes her BP med in afternoon.   Sedentary but no cardiac complaints    Past Medical History:  Diagnosis Date  . Allergy    mild  . Anxiety   . Arthritis   . Gastric polyps   . GERD (gastroesophageal reflux disease)   . Hx of adenomatous colonic polyps 06/13/2014  . Hyperlipidemia   . Hypertension     Past Surgical History:  Procedure Laterality Date  . CHOLECYSTECTOMY    . COLONOSCOPY    . ESOPHAGOGASTRODUODENOSCOPY N/A 09/20/2012   Procedure: ESOPHAGOGASTRODUODENOSCOPY (EGD);  Surgeon: Gatha Mayer, MD;  Location: [facility];  Service: Endoscopy;  Laterality: N/A;  . TOTAL ABDOMINAL HYSTERECTOMY    . UPPER GASTROINTESTINAL ENDOSCOPY       Current Outpatient Medications  Medication Sig Dispense Refill  . acetaminophen (TYLENOL) 650 MG CR tablet Take 650 mg by mouth as needed. (PAIN)    . aspirin 81 MG tablet Take 1 tablet (81 mg total) by mouth daily. 30 tablet   . Coenzyme Q10 (CO Q 10) 100 MG CAPS Take by mouth.    Vivi Ferns HCT 10-160-25 MG TABS Take 1 tablet by mouth Daily.    Marland Kitchen ezetimibe (ZETIA) 10 MG tablet Take 10 mg by mouth daily.    Marland Kitchen LORazepam (ATIVAN) 0.5 MG tablet Take 0.5 mg by mouth 2 (two) times daily.  (ANXIETY)    . pantoprazole (PROTONIX) 40 MG tablet Take 40 mg by mouth daily.    . Probiotic Product (ALIGN PO) Take 1 tablet by mouth as needed.      No current facility-administered medications for this visit.     Allergies:   Crestor [rosuvastatin calcium]; Lipitor [atorvastatin]; Lotrel [amlodipine besy-benazepril hcl]; and Niacin    Social History:  The patient  reports that she has never smoked. She has never used smokeless tobacco. She reports that she does not drink alcohol or use drugs.   Family History:  The patient's family history includes Alzheimer's disease in her father; Breast cancer in her mother; Colon polyps in her son.    ROS:  Please see the history of present illness.   Otherwise, review of systems are positive for none.   All other systems are reviewed and negative.    PHYSICAL EXAM: VS:  BP 140/90   Pulse 86   Ht 5\' 5"  (1.651 m)   Wt 222 lb 12.8 oz (101.1 kg)   SpO2 97%   BMI 37.08 kg/m  , BMI Body mass index is 37.08 kg/m. Affect appropriate Healthy:  appears stated age 33: normal Neck supple with no adenopathy JVP normal faint  left  bruits no thyromegaly Lungs clear with no wheezing and good diaphragmatic motion Heart:  S1/S2 no murmur, no rub, gallop or click PMI normal Abdomen: benighn, BS positve, no tenderness, no AAA no bruit.  No HSM or HJR Distal pulses intact with no bruits No edema Neuro non-focal Skin warm and dry No muscular weakness    EKG:  SR low voltage rate 88 PAC nonspecific ST changes 12/2014  03/08/16 SR rate 76 PVC otherwise normal  03/24/18 SR rate 86 poor R wave Progression    Recent Labs: No results found for requested labs within last 8760 hours.    Lipid Panel    Component Value Date/Time   CHOL  05/26/2009 0515    174        ATP III CLASSIFICATION:  <200     mg/dL   Desirable  200-239  mg/dL   Borderline High  >=240    mg/dL   High          TRIG 177 (H) 05/26/2009 0515   HDL 50 05/26/2009 0515    CHOLHDL 3.5 05/26/2009 0515   VLDL 35 05/26/2009 0515   LDLCALC  05/26/2009 0515    89        Total Cholesterol/HDL:CHD Risk Coronary Heart Disease Risk Table                     Men   Women  1/2 Average Risk   3.4   3.3  Average Risk       5.0   4.4  2 X Average Risk   9.6   7.1  3 X Average Risk  23.4   11.0        Use the calculated Patient Ratio above and the CHD Risk Table to determine the patient's CHD Risk.        ATP III CLASSIFICATION (LDL):  <100     mg/dL   Optimal  100-129  mg/dL   Near or Above                    Optimal  130-159  mg/dL   Borderline  160-189  mg/dL   High  >190     mg/dL   Very High      Wt Readings from Last 3 Encounters:  03/24/18 222 lb 12.8 oz (101.1 kg)  11/08/17 222 lb (100.7 kg)  09/12/17 222 lb 4 oz (100.8 kg)      Other studies Reviewed: Additional studies/ records that were reviewed today include:  Epic notes and records from referring doctor [facility] .    ASSESSMENT AND PLAN:  1.  Chest Pain:  Normal myovue 12/2014 GI overtones  2. HTN: Well controlled.  Continue current medications and low sodium Dash type diet.   3. Chol  On zetia intolerant to niacin and statins f/u lipid clinic  4. Carotid: plaque no stenosis continue risk factor modification and baby aspirin will do f/u duplex Since it has been 4 years    Current medicines are reviewed at length with the patient today.  The patient does not have concerns regarding medicines.  The following changes have been made:  no change  Labs/ tests ordered today include:  Carotid duplex   Orders Placed This Encounter  Procedures  . EKG 12-Lead     Disposition:   FU with me as needed     Signed, Jenkins Rouge, MD  03/24/2018 11:01 AM    [facility] [address], [place], [place]  [address] Phone: [phone]; Fax: [phone]

## 2018-03-24 ENCOUNTER — Encounter (INDEPENDENT_AMBULATORY_CARE_PROVIDER_SITE_OTHER): Payer: Self-pay

## 2018-03-24 ENCOUNTER — Ambulatory Visit: Payer: Medicare Other | Admitting: Cardiovascular Disease

## 2018-03-24 ENCOUNTER — Encounter: Payer: Self-pay | Admitting: Cardiovascular Disease

## 2018-03-24 VITALS — BP 140/90 | HR 86 | Ht 65.0 in | Wt 222.8 lb

## 2018-03-24 DIAGNOSIS — I10 Essential (primary) hypertension: Secondary | ICD-10-CM | POA: Diagnosis not present

## 2018-03-24 DIAGNOSIS — E785 Hyperlipidemia, unspecified: Secondary | ICD-10-CM

## 2018-03-24 DIAGNOSIS — I739 Peripheral vascular disease, unspecified: Secondary | ICD-10-CM

## 2018-03-24 DIAGNOSIS — I779 Disorder of arteries and arterioles, unspecified: Secondary | ICD-10-CM

## 2018-03-24 NOTE — Patient Instructions (Addendum)
Medication Instructions:   If you need a refill on your cardiac medications before your next appointment, please call your pharmacy.   Lab work:  If you have labs (blood work) drawn today and your tests are completely normal, you will receive your results only by: Marland Kitchen MyChart Message (if you have MyChart) OR . A paper copy in the mail If you have any lab test that is abnormal or we need to change your treatment, we will call you to review the results.  Testing/Procedures: Your physician has requested that you have a carotid duplex. This test is an ultrasound of the carotid arteries in your neck. It looks at blood flow through these arteries that supply the brain with blood. Allow one hour for this exam. There are no restrictions or special instructions.  Follow-Up: At [facility], you and your health needs are our priority.  As part of our continuing mission to provide you with exceptional heart care, we have created designated Provider Care Teams.  These Care Teams include your primary Cardiologist (physician) and Advanced Practice Providers (APPs -  Physician Assistants and Nurse Practitioners) who all work together to provide you with the care you need, when you need it. Your physician recommends that you schedule a follow-up appointment as needed with Dr. Johnsie Cancel.

## 2018-03-30 ENCOUNTER — Ambulatory Visit (HOSPITAL_COMMUNITY)
Admission: RE | Admit: 2018-03-30 | Discharge: 2018-03-30 | Disposition: A | Discharge status: Home / Self Care | Payer: Medicare Other | Source: Ambulatory Visit | Attending: Cardiology | Admitting: Cardiology

## 2018-03-30 ENCOUNTER — Other Ambulatory Visit: Payer: Self-pay | Admitting: Cardiovascular Disease

## 2018-03-30 ENCOUNTER — Telehealth: Payer: Self-pay

## 2018-03-30 DIAGNOSIS — I10 Essential (primary) hypertension: Secondary | ICD-10-CM

## 2018-03-30 DIAGNOSIS — I6523 Occlusion and stenosis of bilateral carotid arteries: Secondary | ICD-10-CM

## 2018-03-30 DIAGNOSIS — E785 Hyperlipidemia, unspecified: Secondary | ICD-10-CM

## 2018-03-30 NOTE — Telephone Encounter (Signed)
-----   Message from Josue Hector, MD sent at 03/30/2018 11:06 AM EST ----- Plaque no stenosis f/u carotid duplex in 2 years

## 2018-03-30 NOTE — Telephone Encounter (Signed)
Patient aware of carotid results. Per Dr. Johnsie Cancel, Plaque no stenosis f/u carotid duplex in 2 years. Patient verbalized understanding. Will order carotid to be done in 2 years.

## 2018-10-18 ENCOUNTER — Encounter: Payer: Self-pay | Admitting: Internal Medicine

## 2019-01-12 ENCOUNTER — Other Ambulatory Visit: Payer: Self-pay

## 2019-01-12 DIAGNOSIS — Z20822 Contact with and (suspected) exposure to covid-19: Secondary | ICD-10-CM

## 2019-01-15 LAB — NOVEL CORONAVIRUS, NAA: SARS-CoV-2, NAA: DETECTED — AB

## 2019-01-23 ENCOUNTER — Other Ambulatory Visit: Payer: Self-pay

## 2019-01-23 DIAGNOSIS — Z20822 Contact with and (suspected) exposure to covid-19: Secondary | ICD-10-CM

## 2019-01-24 LAB — NOVEL CORONAVIRUS, NAA: SARS-CoV-2, NAA: DETECTED — AB

## 2019-02-27 ENCOUNTER — Ambulatory Visit: Payer: Medicare Other | Admitting: Internal Medicine

## 2019-02-27 ENCOUNTER — Encounter: Payer: Self-pay | Admitting: Internal Medicine

## 2019-02-27 VITALS — BP 124/78 | HR 92 | Temp 98.4°F | Ht 65.0 in | Wt 212.0 lb

## 2019-02-27 DIAGNOSIS — K317 Polyp of stomach and duodenum: Secondary | ICD-10-CM

## 2019-02-27 DIAGNOSIS — Z8619 Personal history of other infectious and parasitic diseases: Secondary | ICD-10-CM | POA: Diagnosis not present

## 2019-02-27 DIAGNOSIS — Z8616 Personal history of COVID-19: Secondary | ICD-10-CM

## 2019-02-27 NOTE — Progress Notes (Signed)
   [name] 78 y.o. May 04, 1940 [id number]  Assessment & Plan:    EGD No Covid test   Subjective:   Chief Complaint: f/u gastric polyps  HPI Hx multiple gastric polyps - hyperplastic and fundic gland.10 mm hyperplastic snared last year. Also hx adenomatous colon polyp - due 2022 f/u possibly (will be 80)  She is ok now - had "mild Covid" 12/2018. Anosmia/dysgeusia remain    Allergies  Allergen Reactions  . Crestor [Rosuvastatin Calcium]     Myalgias even with 5mg  daily dosing  . Lipitor [Atorvastatin]     Myalgias  . Lotrel [Amlodipine Besy-Benazepril Hcl]   . Niacin Other (See Comments)    "FLUSHED"   Current Meds  Medication Sig  . acetaminophen (TYLENOL) 650 MG CR tablet Take 650 mg by mouth as needed. (PAIN)  . aspirin 81 MG tablet Take 1 tablet (81 mg total) by mouth daily.  . Coenzyme Q10 (CO Q 10) 100 MG CAPS Take by mouth.  Grace Mccann HCT 10-160-25 MG TABS Take 1 tablet by mouth Daily.  Marland Kitchen ezetimibe (ZETIA) 10 MG tablet Take 10 mg by mouth daily.  Marland Kitchen LORazepam (ATIVAN) 0.5 MG tablet Take 0.5 mg by mouth 2 (two) times daily. (ANXIETY)  . pantoprazole (PROTONIX) 40 MG tablet Take 40 mg by mouth daily.  . Probiotic Product (ALIGN PO) Take 1 tablet by mouth as needed.    Past Medical History:  Diagnosis Date  . Allergy    mild  . Anxiety   . Arthritis   . COVID-19   . Gastric polyps   . GERD (gastroesophageal reflux disease)   . Hx of adenomatous colonic polyps 06/13/2014  . Hyperlipidemia   . Hypertension    Past Surgical History:  Procedure Laterality Date  . CHOLECYSTECTOMY    . COLONOSCOPY    . ESOPHAGOGASTRODUODENOSCOPY N/A 09/20/2012   Procedure: ESOPHAGOGASTRODUODENOSCOPY (EGD);  Surgeon: Gatha Mayer, MD;  Location: [facility];  Service: Endoscopy;  Laterality: N/A;  . TOTAL ABDOMINAL HYSTERECTOMY    . UPPER GASTROINTESTINAL ENDOSCOPY     Social History   Social History Narrative   Married - lives w/ husband   Retired school bus  driver   3 kids 3 grandkids - live nearby   No alcohol tobacco or drug use   family history includes Alzheimer's disease in her father; Breast cancer in her mother; Colon polyps in her son.   Review of Systems As above  Objective:   Physical Exam BP 124/78 (BP Location: Left Arm, Patient Position: Sitting, Cuff Size: Normal)   Pulse 92   Temp 98.4 F (36.9 C)   Ht 5\' 5"  (1.651 m)   Wt 212 lb (96.2 kg)   BMI 35.28 kg/m  NAD

## 2019-02-27 NOTE — Patient Instructions (Signed)
  You have been scheduled for an endoscopy. Please follow written instructions given to you at your visit today. If you use inhalers (even only as needed), please bring them with you on the day of your procedure.   I appreciate the opportunity to care for you. Carl Gessner, MD, FACG 

## 2019-03-01 ENCOUNTER — Other Ambulatory Visit: Payer: Self-pay

## 2019-03-01 ENCOUNTER — Ambulatory Visit (AMBULATORY_SURGERY_CENTER): Payer: Medicare Other | Admitting: Internal Medicine

## 2019-03-01 ENCOUNTER — Encounter: Payer: Self-pay | Admitting: Internal Medicine

## 2019-03-01 VITALS — BP 131/63 | HR 78 | Temp 97.8°F | Resp 22 | Ht 65.0 in | Wt 212.0 lb

## 2019-03-01 DIAGNOSIS — K317 Polyp of stomach and duodenum: Secondary | ICD-10-CM

## 2019-03-01 MED ORDER — SODIUM CHLORIDE 0.9 % IV SOLN: 500.0000 mL | Freq: Once | INTRAVENOUS | Status: DC

## 2019-03-01 NOTE — Progress Notes (Signed)
Called to room to assist during endoscopic procedure.  Patient ID and intended procedure confirmed with present staff. Received instructions for my participation in the procedure from the performing physician.  

## 2019-03-01 NOTE — Progress Notes (Signed)
Temp by [place] Hun by DT

## 2019-03-01 NOTE — Op Note (Signed)
[facility] Patient Name: Grace Mccann Procedure Date: 03/01/2019 8:50 AM MRN: [id number] Endoscopist: Gatha Mayer , MD Age: 78 Referring MD:  Date of Birth: 07-Apr-1940 Gender: Female Account #: [id number] Procedure:                Upper GI endoscopy Indications:              Gastric polyps, Follow-up of gastric polyps Medicines:                Propofol per Anesthesia, Monitored Anesthesia Care Procedure:                Pre-Anesthesia Assessment:                           - Prior to the procedure, a History and Physical                            was performed, and patient medications and                            allergies were reviewed. The patient's tolerance of                            previous anesthesia was also reviewed. The risks                            and benefits of the procedure and the sedation                            options and risks were discussed with the patient.                            All questions were answered, and informed consent                            was obtained. Prior Anticoagulants: The patient has                            taken no previous anticoagulant or antiplatelet                            agents. ASA Grade Assessment: II - A patient with                            mild systemic disease. After reviewing the risks                            and benefits, the patient was deemed in                            satisfactory condition to undergo the procedure.                           After obtaining informed consent, the endoscope was  passed under direct vision. Throughout the                            procedure, the patient's blood pressure, pulse, and                            oxygen saturations were monitored continuously. The                            Endoscope was introduced through the mouth, and                            advanced to the second part of duodenum. The upper               GI endoscopy was accomplished without difficulty.                            The patient tolerated the procedure well. Scope In: Scope Out: Findings:                 Numerous sub-cm sessile polyps with no stigmata of                            recent bleeding were found in the cardia, in the                            gastric fundus and in the gastric body. Biopsies                            were taken with a cold forceps for histology.                            Verification of patient identification for the                            specimen was done. Estimated blood loss was minimal.                           The exam was otherwise without abnormality.                           The cardia and gastric fundus were otherwise normal                            on retroflexion. Complications:            No immediate complications. Estimated Blood Loss:     Estimated blood loss was minimal. Impression:               - Numerous gastric polyps. Biopsied.                           - The examination was otherwise normal. Recommendation:           - Patient has a contact number available for  emergencies. The signs and symptoms of potential                            delayed complications were discussed with the                            patient. Return to normal activities tomorrow.                            Written discharge instructions were provided to the                            patient.                           - Resume previous diet.                           - Continue present medications.                           - Await pathology results. Gatha Mayer, MD 03/01/2019 9:19:49 AM This report has been signed electronically.

## 2019-03-01 NOTE — Patient Instructions (Addendum)
You still have a bunch of polyps - the ones I saw today all look innocent and benign. I took some sample biopsies and will let you know.  I appreciate the opportunity to care for you. Gatha Mayer, MD, FACG  YOU HAD AN ENDOSCOPIC PROCEDURE TODAY AT [facility]:   Refer to the procedure report that was given to you for any specific questions about what was found during the examination.  If the procedure report does not answer your questions, please call your gastroenterologist to clarify.  If you requested that your care partner not be given the details of your procedure findings, then the procedure report has been included in a sealed envelope for you to review at your convenience later.  YOU SHOULD EXPECT: Some feelings of bloating in the abdomen. Passage of more gas than usual.  Walking can help get rid of the air that was put into your GI tract during the procedure and reduce the bloating. If you had a lower endoscopy (such as a colonoscopy or flexible sigmoidoscopy) you may notice spotting of blood in your stool or on the toilet paper. If you underwent a bowel prep for your procedure, you may not have a normal bowel movement for a few days.  Please Note:  You might notice some irritation and congestion in your nose or some drainage.  This is from the oxygen used during your procedure.  There is no need for concern and it should clear up in a day or so.  SYMPTOMS TO REPORT IMMEDIATELY:    Following upper endoscopy (EGD)  Vomiting of blood or coffee ground material  New chest pain or pain under the shoulder blades  Painful or persistently difficult swallowing  New shortness of breath  Fever of 100F or higher  Black, tarry-looking stools  For urgent or emergent issues, a gastroenterologist can be reached at any hour by calling [phone].   DIET:  We do recommend a small meal at first, but then you may proceed to your regular diet.  Drink plenty of fluids but you  should avoid alcoholic beverages for 24 hours.  ACTIVITY:  You should plan to take it easy for the rest of today and you should NOT DRIVE or use heavy machinery until tomorrow (because of the sedation medicines used during the test).    FOLLOW UP: Our staff will call the number listed on your records 48-72 hours following your procedure to check on you and address any questions or concerns that you may have regarding the information given to you following your procedure. If we do not reach you, we will leave a message.  We will attempt to reach you two times.  During this call, we will ask if you have developed any symptoms of COVID 19. If you develop any symptoms (ie: fever, flu-like symptoms, shortness of breath, cough etc.) before then, please call [phone].  If you test positive for Covid 19 in the 2 weeks post procedure, please call and report this information to [place].    If any biopsies were taken you will be contacted by phone or by letter within the next 1-3 weeks.  Please call us at [phone] if you have not heard about the biopsies in 3 weeks.    SIGNATURES/CONFIDENTIALITY: You and/or your care partner have signed paperwork which will be entered into your electronic medical record.  These signatures attest to the fact that that the information above on your After Visit Summary has  been reviewed and is understood.  Full responsibility of the confidentiality of this discharge information lies with you and/or your care-partner.

## 2019-03-01 NOTE — Progress Notes (Signed)
To PACU, VSS. Report to [email or web address] 

## 2019-03-02 HISTORY — PX: CATARACT EXTRACTION: SUR2

## 2019-03-06 ENCOUNTER — Telehealth: Payer: Self-pay | Admitting: *Deleted

## 2019-03-06 ENCOUNTER — Encounter: Payer: Self-pay | Admitting: Internal Medicine

## 2019-03-06 NOTE — Telephone Encounter (Signed)
  Follow up Call-  Call back number 03/01/2019 11/08/2017  Post procedure Call Back phone  # [phone]  Permission to leave phone message Yes Yes  Some recent data might be hidden     Patient questions:  Do you have a fever, pain , or abdominal swelling? No. Pain Score  0 *  Have you tolerated food without any problems? Yes.    Have you been able to return to your normal activities? Yes.    Do you have any questions about your discharge instructions: Diet   No. Medications  No. Follow up visit  No.  Do you have questions or concerns about your Care? No.  Actions: * If pain score is 4 or above: No action needed, pain <4.  1. Have you developed a fever since your procedure? no  2.   Have you had an respiratory symptoms (SOB or cough) since your procedure? no  3.   Have you tested positive for COVID 19 since your procedure no  4.   Have you had any family members/close contacts diagnosed with the COVID 19 since your procedure?  no   If yes to any of these questions please route to Joylene John, RN and Alphonsa Gin, Therapist, sports.

## 2020-02-11 DIAGNOSIS — M859 Disorder of bone density and structure, unspecified: Secondary | ICD-10-CM | POA: Diagnosis not present

## 2020-02-11 DIAGNOSIS — E7800 Pure hypercholesterolemia, unspecified: Secondary | ICD-10-CM | POA: Diagnosis not present

## 2020-02-18 DIAGNOSIS — Z0000 Encounter for general adult medical examination without abnormal findings: Secondary | ICD-10-CM | POA: Diagnosis not present

## 2020-02-18 DIAGNOSIS — I10 Essential (primary) hypertension: Secondary | ICD-10-CM | POA: Diagnosis not present

## 2020-02-18 DIAGNOSIS — E785 Hyperlipidemia, unspecified: Secondary | ICD-10-CM | POA: Diagnosis not present

## 2020-02-18 DIAGNOSIS — R82998 Other abnormal findings in urine: Secondary | ICD-10-CM | POA: Diagnosis not present

## 2020-02-18 DIAGNOSIS — M791 Myalgia, unspecified site: Secondary | ICD-10-CM | POA: Diagnosis not present

## 2020-02-18 DIAGNOSIS — I6523 Occlusion and stenosis of bilateral carotid arteries: Secondary | ICD-10-CM | POA: Diagnosis not present

## 2020-02-18 DIAGNOSIS — G720 Drug-induced myopathy: Secondary | ICD-10-CM | POA: Diagnosis not present

## 2020-02-18 DIAGNOSIS — M5416 Radiculopathy, lumbar region: Secondary | ICD-10-CM | POA: Diagnosis not present

## 2020-02-18 DIAGNOSIS — Z8616 Personal history of COVID-19: Secondary | ICD-10-CM | POA: Diagnosis not present

## 2020-02-18 DIAGNOSIS — M8580 Other specified disorders of bone density and structure, unspecified site: Secondary | ICD-10-CM | POA: Diagnosis not present

## 2020-02-18 DIAGNOSIS — Z1339 Encounter for screening examination for other mental health and behavioral disorders: Secondary | ICD-10-CM | POA: Diagnosis not present

## 2020-02-18 DIAGNOSIS — Z1331 Encounter for screening for depression: Secondary | ICD-10-CM | POA: Diagnosis not present

## 2020-02-18 DIAGNOSIS — Z23 Encounter for immunization: Secondary | ICD-10-CM | POA: Diagnosis not present

## 2020-02-19 DIAGNOSIS — Z1212 Encounter for screening for malignant neoplasm of rectum: Secondary | ICD-10-CM | POA: Diagnosis not present

## 2020-02-20 ENCOUNTER — Other Ambulatory Visit (HOSPITAL_COMMUNITY): Payer: Self-pay | Admitting: Internal Medicine

## 2020-02-20 DIAGNOSIS — I6529 Occlusion and stenosis of unspecified carotid artery: Secondary | ICD-10-CM

## 2020-02-21 ENCOUNTER — Other Ambulatory Visit: Payer: Self-pay

## 2020-02-21 ENCOUNTER — Ambulatory Visit (HOSPITAL_COMMUNITY)
Admission: RE | Admit: 2020-02-21 | Discharge: 2020-02-21 | Disposition: A | Discharge status: Home / Self Care | Payer: Medicare PPO | Source: Ambulatory Visit | Attending: Internal Medicine | Admitting: Internal Medicine

## 2020-02-21 DIAGNOSIS — I6529 Occlusion and stenosis of unspecified carotid artery: Secondary | ICD-10-CM

## 2020-05-21 DIAGNOSIS — H02834 Dermatochalasis of left upper eyelid: Secondary | ICD-10-CM | POA: Diagnosis not present

## 2020-05-21 DIAGNOSIS — H25813 Combined forms of age-related cataract, bilateral: Secondary | ICD-10-CM | POA: Diagnosis not present

## 2020-05-21 DIAGNOSIS — H04123 Dry eye syndrome of bilateral lacrimal glands: Secondary | ICD-10-CM | POA: Diagnosis not present

## 2020-05-21 DIAGNOSIS — H1045 Other chronic allergic conjunctivitis: Secondary | ICD-10-CM | POA: Diagnosis not present

## 2020-05-21 DIAGNOSIS — H02831 Dermatochalasis of right upper eyelid: Secondary | ICD-10-CM | POA: Diagnosis not present

## 2020-05-21 DIAGNOSIS — H43811 Vitreous degeneration, right eye: Secondary | ICD-10-CM | POA: Diagnosis not present

## 2020-07-03 DIAGNOSIS — H2512 Age-related nuclear cataract, left eye: Secondary | ICD-10-CM | POA: Diagnosis not present

## 2020-07-03 DIAGNOSIS — H2511 Age-related nuclear cataract, right eye: Secondary | ICD-10-CM | POA: Diagnosis not present

## 2020-07-17 DIAGNOSIS — H2512 Age-related nuclear cataract, left eye: Secondary | ICD-10-CM | POA: Diagnosis not present

## 2020-10-28 DIAGNOSIS — Z1231 Encounter for screening mammogram for malignant neoplasm of breast: Secondary | ICD-10-CM | POA: Diagnosis not present

## 2021-02-18 DIAGNOSIS — H02834 Dermatochalasis of left upper eyelid: Secondary | ICD-10-CM | POA: Diagnosis not present

## 2021-02-18 DIAGNOSIS — H43811 Vitreous degeneration, right eye: Secondary | ICD-10-CM | POA: Diagnosis not present

## 2021-02-18 DIAGNOSIS — H02831 Dermatochalasis of right upper eyelid: Secondary | ICD-10-CM | POA: Diagnosis not present

## 2021-02-18 DIAGNOSIS — Z961 Presence of intraocular lens: Secondary | ICD-10-CM | POA: Diagnosis not present

## 2021-02-18 DIAGNOSIS — H11822 Conjunctivochalasis, left eye: Secondary | ICD-10-CM | POA: Diagnosis not present

## 2021-02-18 DIAGNOSIS — H26491 Other secondary cataract, right eye: Secondary | ICD-10-CM | POA: Diagnosis not present

## 2021-02-18 DIAGNOSIS — H1045 Other chronic allergic conjunctivitis: Secondary | ICD-10-CM | POA: Diagnosis not present

## 2021-02-18 DIAGNOSIS — H04123 Dry eye syndrome of bilateral lacrimal glands: Secondary | ICD-10-CM | POA: Diagnosis not present

## 2021-03-16 DIAGNOSIS — E785 Hyperlipidemia, unspecified: Secondary | ICD-10-CM | POA: Diagnosis not present

## 2021-03-16 DIAGNOSIS — R7301 Impaired fasting glucose: Secondary | ICD-10-CM | POA: Diagnosis not present

## 2021-03-16 DIAGNOSIS — M859 Disorder of bone density and structure, unspecified: Secondary | ICD-10-CM | POA: Diagnosis not present

## 2021-03-16 DIAGNOSIS — I10 Essential (primary) hypertension: Secondary | ICD-10-CM | POA: Diagnosis not present

## 2021-03-18 ENCOUNTER — Encounter: Payer: Self-pay | Admitting: Internal Medicine

## 2021-03-26 DIAGNOSIS — I10 Essential (primary) hypertension: Secondary | ICD-10-CM | POA: Diagnosis not present

## 2021-03-26 DIAGNOSIS — M791 Myalgia, unspecified site: Secondary | ICD-10-CM | POA: Diagnosis not present

## 2021-03-26 DIAGNOSIS — E785 Hyperlipidemia, unspecified: Secondary | ICD-10-CM | POA: Diagnosis not present

## 2021-03-26 DIAGNOSIS — I6523 Occlusion and stenosis of bilateral carotid arteries: Secondary | ICD-10-CM | POA: Diagnosis not present

## 2021-03-26 DIAGNOSIS — M8580 Other specified disorders of bone density and structure, unspecified site: Secondary | ICD-10-CM | POA: Diagnosis not present

## 2021-03-26 DIAGNOSIS — G720 Drug-induced myopathy: Secondary | ICD-10-CM | POA: Diagnosis not present

## 2021-03-26 DIAGNOSIS — R82998 Other abnormal findings in urine: Secondary | ICD-10-CM | POA: Diagnosis not present

## 2021-03-26 DIAGNOSIS — Z0000 Encounter for general adult medical examination without abnormal findings: Secondary | ICD-10-CM | POA: Diagnosis not present

## 2021-03-26 DIAGNOSIS — F1320 Sedative, hypnotic or anxiolytic dependence, uncomplicated: Secondary | ICD-10-CM | POA: Diagnosis not present

## 2021-03-26 DIAGNOSIS — R7301 Impaired fasting glucose: Secondary | ICD-10-CM | POA: Diagnosis not present

## 2021-03-26 DIAGNOSIS — Z1331 Encounter for screening for depression: Secondary | ICD-10-CM | POA: Diagnosis not present

## 2021-03-26 DIAGNOSIS — Z1339 Encounter for screening examination for other mental health and behavioral disorders: Secondary | ICD-10-CM | POA: Diagnosis not present

## 2021-04-22 DIAGNOSIS — M170 Bilateral primary osteoarthritis of knee: Secondary | ICD-10-CM | POA: Diagnosis not present

## 2021-04-22 DIAGNOSIS — M545 Low back pain, unspecified: Secondary | ICD-10-CM | POA: Diagnosis not present

## 2021-07-08 DIAGNOSIS — H04123 Dry eye syndrome of bilateral lacrimal glands: Secondary | ICD-10-CM | POA: Diagnosis not present

## 2021-07-08 DIAGNOSIS — H26491 Other secondary cataract, right eye: Secondary | ICD-10-CM | POA: Diagnosis not present

## 2021-07-08 DIAGNOSIS — H1045 Other chronic allergic conjunctivitis: Secondary | ICD-10-CM | POA: Diagnosis not present

## 2021-07-08 DIAGNOSIS — H43811 Vitreous degeneration, right eye: Secondary | ICD-10-CM | POA: Diagnosis not present

## 2021-07-08 DIAGNOSIS — H02834 Dermatochalasis of left upper eyelid: Secondary | ICD-10-CM | POA: Diagnosis not present

## 2021-07-08 DIAGNOSIS — H11822 Conjunctivochalasis, left eye: Secondary | ICD-10-CM | POA: Diagnosis not present

## 2021-07-08 DIAGNOSIS — Z961 Presence of intraocular lens: Secondary | ICD-10-CM | POA: Diagnosis not present

## 2021-07-08 DIAGNOSIS — H02831 Dermatochalasis of right upper eyelid: Secondary | ICD-10-CM | POA: Diagnosis not present

## 2021-07-28 DIAGNOSIS — H26491 Other secondary cataract, right eye: Secondary | ICD-10-CM | POA: Diagnosis not present

## 2021-10-29 DIAGNOSIS — Z780 Asymptomatic menopausal state: Secondary | ICD-10-CM | POA: Diagnosis not present

## 2021-10-29 DIAGNOSIS — M85851 Other specified disorders of bone density and structure, right thigh: Secondary | ICD-10-CM | POA: Diagnosis not present

## 2021-10-29 DIAGNOSIS — M85852 Other specified disorders of bone density and structure, left thigh: Secondary | ICD-10-CM | POA: Diagnosis not present

## 2021-10-29 DIAGNOSIS — Z1231 Encounter for screening mammogram for malignant neoplasm of breast: Secondary | ICD-10-CM | POA: Diagnosis not present

## 2021-11-09 DIAGNOSIS — M545 Low back pain, unspecified: Secondary | ICD-10-CM | POA: Diagnosis not present

## 2021-11-09 DIAGNOSIS — N390 Urinary tract infection, site not specified: Secondary | ICD-10-CM | POA: Diagnosis not present

## 2021-11-09 DIAGNOSIS — M4186 Other forms of scoliosis, lumbar region: Secondary | ICD-10-CM | POA: Diagnosis not present

## 2021-11-09 DIAGNOSIS — R1031 Right lower quadrant pain: Secondary | ICD-10-CM | POA: Diagnosis not present

## 2021-11-09 DIAGNOSIS — I10 Essential (primary) hypertension: Secondary | ICD-10-CM | POA: Diagnosis not present

## 2021-11-09 DIAGNOSIS — M1990 Unspecified osteoarthritis, unspecified site: Secondary | ICD-10-CM | POA: Diagnosis not present

## 2021-11-09 DIAGNOSIS — M5136 Other intervertebral disc degeneration, lumbar region: Secondary | ICD-10-CM | POA: Diagnosis not present

## 2021-11-09 DIAGNOSIS — M47816 Spondylosis without myelopathy or radiculopathy, lumbar region: Secondary | ICD-10-CM | POA: Diagnosis not present

## 2021-11-13 DIAGNOSIS — M25562 Pain in left knee: Secondary | ICD-10-CM | POA: Diagnosis not present

## 2021-11-13 DIAGNOSIS — M5416 Radiculopathy, lumbar region: Secondary | ICD-10-CM | POA: Diagnosis not present

## 2021-11-13 DIAGNOSIS — N390 Urinary tract infection, site not specified: Secondary | ICD-10-CM | POA: Diagnosis not present

## 2021-11-13 DIAGNOSIS — M25561 Pain in right knee: Secondary | ICD-10-CM | POA: Diagnosis not present

## 2021-12-29 ENCOUNTER — Ambulatory Visit: Payer: Medicare PPO | Admitting: Internal Medicine

## 2021-12-29 ENCOUNTER — Encounter: Payer: Self-pay | Admitting: Internal Medicine

## 2021-12-29 VITALS — BP 120/74 | HR 88 | Ht 66.0 in | Wt 212.4 lb

## 2021-12-29 DIAGNOSIS — R1319 Other dysphagia: Secondary | ICD-10-CM | POA: Diagnosis not present

## 2021-12-29 DIAGNOSIS — Z86010 Personal history of colonic polyps: Secondary | ICD-10-CM

## 2021-12-29 DIAGNOSIS — K317 Polyp of stomach and duodenum: Secondary | ICD-10-CM | POA: Diagnosis not present

## 2021-12-29 NOTE — Patient Instructions (Signed)
You have been scheduled for an endoscopy and colonoscopy. Please follow the written instructions given to you at your visit today. Please pick up your prep supplies at the pharmacy within the next 1-3 days. If you use inhalers (even only as needed), please bring them with you on the day of your procedure.   Due to recent changes in healthcare laws, you may see the results of your imaging and laboratory studies on MyChart before your provider has had a chance to review them.  We understand that in some cases there may be results that are confusing or concerning to you. Not all laboratory results come back in the same time frame and the provider may be waiting for multiple results in order to interpret others.  Please give us 48 hours in order for your provider to thoroughly review all the results before contacting the office for clarification of your results.    I appreciate the opportunity to care for you. Carl Gessner, MD, FACG 

## 2021-12-29 NOTE — Progress Notes (Unsigned)
Grace Mccann 81 y.o. 07/11/1940 [id number]  Assessment & Plan:   Encounter Diagnoses  Name Primary?   Esophageal dysphagia Yes   Gastric polyps    Hx of adenomatous colonic polyps     Evaluate dysphagia with EGD and will follow-up gastric polyps as well.  On the last exam in 2020 the polyps were all fundic gland polyps.  She has had hyperplastic polyps biopsied and snared in the past.  Possible esophageal dilation given dysphagia.  She did have a Maloney dilation (36 [place]) in 2011 with complaints of dysphagia.  Though she is 66 there is a significant polyp history we have decided to proceed with 1 more colonoscopy for polyp surveillance and removal if present.  The risks and benefits as well as alternatives of endoscopic procedure(s) have been discussed and reviewed. All questions answered. The patient agrees to proceed.   I appreciate the opportunity to care for this patient. CC: Ginger Organ., MD   Subjective:   Chief Complaint: Dysphagia, history of colon polyps  HPI 81 year old white woman with a history of gastric polyps (fundic gland and hyperplastic last evaluated December 2020), personal history of colonic adenomas (8 adenomas max 15 to 20 mm 2016 and 6 small adenomas 2019) who is here for follow-up and complaints of dysphagia.  Other medical problems include hypertension and hyperlipidemia.  She is not having heartburn on her pantoprazole but is having problems with large pill dysphagia and solid food dysphagia at times though the pill dysphagia is more of a problem than food dysphagia.  However solid foods do sometimes cause a supra or midsternal stick sensation.  Ever since her cholecystectomy she has had loose stools after breakfast.  That is manageable and unchanged.  There is no significant abdominal pain.  We had planned 2023 years after her last 4 years we are reviewing.  She is open to the idea of repeating colonoscopy.  She is still very functional  though has some gait alterations and moves slowly. Allergies  Allergen Reactions   Crestor [Rosuvastatin Calcium]     Myalgias even with '5mg'$  daily dosing   Lipitor [Atorvastatin]     Myalgias   Lotrel [Amlodipine Besy-Benazepril Hcl]    Niacin Other (See Comments)    "FLUSHED"   Current Meds  Medication Sig   acetaminophen (TYLENOL) 650 MG CR tablet Take 3,900 mg by mouth daily in the afternoon. (PAIN)   amLODipine (NORVASC) 10 MG tablet Take 10 mg by mouth daily.   aspirin 81 MG tablet Take 1 tablet (81 mg total) by mouth daily.   Cholecalciferol (VITAMIN D3) 50 MCG (2000 UT) TABS Take 100 mcg by mouth daily in the afternoon.   Coenzyme Q10 (CO Q 10) 100 MG CAPS Take by mouth.   ezetimibe (ZETIA) 10 MG tablet Take 10 mg by mouth daily.   hydrochlorothiazide (MICROZIDE) 12.5 MG capsule Take 12.5 mg by mouth daily.   LORazepam (ATIVAN) 0.5 MG tablet Take 0.5 mg by mouth 2 (two) times daily. (ANXIETY)   pantoprazole (PROTONIX) 40 MG tablet Take 40 mg by mouth daily.   valsartan (DIOVAN) 160 MG tablet Take 160 mg by mouth daily.   Past Medical History:  Diagnosis Date   Allergy    mild   Anxiety    Arthritis    COVID-19    Gastric polyps    GERD (gastroesophageal reflux disease)    Hx of adenomatous colonic polyps 06/13/2014   Hyperlipidemia    Hypertension  Past Surgical History:  Procedure Laterality Date   CATARACT EXTRACTION Bilateral 2021   CHOLECYSTECTOMY     COLONOSCOPY     ESOPHAGOGASTRODUODENOSCOPY N/A 09/20/2012   Procedure: ESOPHAGOGASTRODUODENOSCOPY (EGD);  Surgeon: Gatha Mayer, MD;  Location: [facility];  Service: Endoscopy;  Laterality: N/A;   TOTAL ABDOMINAL HYSTERECTOMY     UPPER GASTROINTESTINAL ENDOSCOPY     Social History   Social History Narrative   Married - lives w/ husband   Retired school bus driver   3 kids 3 grandkids - live nearby   No alcohol tobacco or drug use   family history includes Alzheimer's disease in her father; Breast  cancer in her mother; Colon polyps in her son.   Review of Systems As per HPI  Objective:   Physical Exam '@BP'$  120/74   Pulse 88   Ht '5\' 6"'$  (1.676 m)   Wt 212 lb 6 oz (96.3 kg)   BMI 34.28 kg/m @  General:  NAD Eyes:   Anicteric Neck:  Supple without thyromegaly or mass Mouth: Dentition intact but in fair shape Lungs:  clear Heart::  S1S2 no rubs, murmurs or gallops Abdomen:  soft and nontender, BS+, obese no HSM Ext:   no edema, cyanosis or clubbing    Data Reviewed:   see HPI

## 2022-02-09 NOTE — Progress Notes (Unsigned)
[facility] History and Physical   Primary Care Physician:  Ginger Organ., MD   Reason for Procedure:   Hx colon polyps and dysphagia + hx gastric polyps  Plan:    EGD + esophageal dilation, colonoscopy     HPI: Grace Mccann is a 81 y.o. female  with a history of gastric polyps (fundic gland and hyperplastic last evaluated December 2020), personal history of colonic adenomas (8 adenomas max 15 to 20 mm 2016 and 6 small adenomas 2019) who is here for follow-up and complaints of dysphagia.  Other medical problems include hypertension and hyperlipidemia.   She is not having heartburn on her pantoprazole but is having problems with large pill dysphagia and solid food dysphagia at times though the pill dysphagia is more of a problem than food dysphagia.  However solid foods do sometimes cause a supra or midsternal stick sensation.  Ever since her cholecystectomy she has had loose stools after breakfast.  That is manageable and unchanged.  There is no significant abdominal pain.    She is still very functional though has some gait alterations and moves slowly.  05/2014 - 8 adenomas max 15-20 mm 10/2017 6 adenomas - small    Past Medical History:  Diagnosis Date   Allergy    mild   Anxiety    Arthritis    COVID-19    Gastric polyps    GERD (gastroesophageal reflux disease)    Hx of adenomatous colonic polyps 06/13/2014   Hyperlipidemia    Hypertension     Past Surgical History:  Procedure Laterality Date   CATARACT EXTRACTION Bilateral 2021   CHOLECYSTECTOMY     COLONOSCOPY     ESOPHAGOGASTRODUODENOSCOPY N/A 09/20/2012   Procedure: ESOPHAGOGASTRODUODENOSCOPY (EGD);  Surgeon: Gatha Mayer, MD;  Location: [facility];  Service: Endoscopy;  Laterality: N/A;   TOTAL ABDOMINAL HYSTERECTOMY     UPPER GASTROINTESTINAL ENDOSCOPY      Prior to Admission medications   Medication Sig Start Date End Date Taking? Authorizing Provider  acetaminophen (TYLENOL) 650 MG CR  tablet Take 3,900 mg by mouth daily in the afternoon. (PAIN)    [provider]  amLODipine (NORVASC) 10 MG tablet Take 10 mg by mouth daily.    [provider]  aspirin 81 MG tablet Take 1 tablet (81 mg total) by mouth daily. 11/08/17   Gatha Mayer, MD  Cholecalciferol (VITAMIN D3) 50 MCG (2000 UT) TABS Take 100 mcg by mouth daily in the afternoon.    [provider]  Coenzyme Q10 (CO Q 10) 100 MG CAPS Take by mouth.    [provider]  ezetimibe (ZETIA) 10 MG tablet Take 10 mg by mouth daily.    [provider]  hydrochlorothiazide (MICROZIDE) 12.5 MG capsule Take 12.5 mg by mouth daily.    [provider]  LORazepam (ATIVAN) 0.5 MG tablet Take 0.5 mg by mouth 2 (two) times daily. (ANXIETY) 11/21/14   [provider]  pantoprazole (PROTONIX) 40 MG tablet Take 40 mg by mouth daily. 12/03/14 12/29/21  [provider]  valsartan (DIOVAN) 160 MG tablet Take 160 mg by mouth daily.    [provider]    Current Outpatient Medications  Medication Sig Dispense Refill   acetaminophen (TYLENOL) 650 MG CR tablet Take 3,900 mg by mouth daily in the afternoon. (PAIN)     amLODipine (NORVASC) 10 MG tablet Take 10 mg by mouth daily.     aspirin 81 MG tablet Take  1 tablet (81 mg total) by mouth daily. 30 tablet    Cholecalciferol (VITAMIN D3) 50 MCG (2000 UT) TABS Take 100 mcg by mouth daily in the afternoon.     Coenzyme Q10 (CO Q 10) 100 MG CAPS Take by mouth.     ezetimibe (ZETIA) 10 MG tablet Take 10 mg by mouth daily.     hydrochlorothiazide (MICROZIDE) 12.5 MG capsule Take 12.5 mg by mouth daily.     LORazepam (ATIVAN) 0.5 MG tablet Take 0.5 mg by mouth 2 (two) times daily. (ANXIETY)     valsartan (DIOVAN) 160 MG tablet Take 160 mg by mouth daily.     pantoprazole (PROTONIX) 40 MG tablet Take 40 mg by mouth daily.     Current Facility-Administered Medications  Medication Dose Route Frequency Provider Last Rate Last  Admin   0.9 %  sodium chloride infusion  500 mL Intravenous Once Gatha Mayer, MD        Allergies as of 02/10/2022 - Review Complete 02/10/2022  Allergen Reaction Noted   Crestor [rosuvastatin calcium]  06/21/2016   Lipitor [atorvastatin]  06/21/2016   Lotrel [amlodipine besy-benazepril hcl]  11/08/2017   Niacin Other (See Comments)     Family History  Problem Relation Age of Onset   Breast cancer Mother    Alzheimer's disease Father    Colon polyps Son    Colon cancer Neg Hx    Esophageal cancer Neg Hx    Rectal cancer Neg Hx    Stomach cancer Neg Hx     Social History   Socioeconomic History   Marital status: Married    Spouse name: Not on file   Number of children: 3   Years of education: Not on file   Highest education level: Not on file  Occupational History    Employer: RETIRED  Tobacco Use   Smoking status: Never   Smokeless tobacco: Never  Vaping Use   Vaping Use: Never used  Substance and Sexual Activity   Alcohol use: No   Drug use: No   Sexual activity: Not on file  Other Topics Concern   Not on file  Social History Narrative   Married - lives w/ husband   Retired school bus driver   3 kids 3 grandkids - live nearby   No alcohol tobacco or drug use   Social Determinants of Radio broadcast assistant Strain: Not on Art therapist Insecurity: Not on file  Transportation Needs: Not on file  Physical Activity: Not on file  Stress: Not on file  Social Connections: Not on file  Intimate Partner Violence: Not on file    Review of Systems: Positive for back pain flare All other review of systems negative except as mentioned in the HPI.  Physical Exam: Vital signs BP 136/70   Pulse 85   Temp (!) 97.3 F (36.3 C) (Temporal)   Resp 14   Ht '5\' 6"'$  (1.676 m)   Wt 212 lb (96.2 kg)   SpO2 95%   BMI 34.22 kg/m   General:   Alert,  Well-developed, well-nourished, pleasant and cooperative in NAD Lungs:  Clear throughout to auscultation.    Heart:  Regular rate and rhythm; no murmurs, clicks, rubs,  or gallops. Abdomen:  Soft, nontender and nondistended. Normal bowel sounds.   Neuro/Psych:  Alert and cooperative. Normal mood and affect. A and O x 3   '@Meshulem Onorato'$  Simonne Maffucci, MD, [facility] [phone] (pager) 02/10/2022 8:01 AM@

## 2022-02-10 ENCOUNTER — Ambulatory Visit (AMBULATORY_SURGERY_CENTER): Payer: Medicare PPO | Admitting: Internal Medicine

## 2022-02-10 ENCOUNTER — Encounter: Payer: Self-pay | Admitting: Internal Medicine

## 2022-02-10 VITALS — BP 133/88 | HR 78 | Temp 97.3°F | Resp 12 | Ht 66.0 in | Wt 212.0 lb

## 2022-02-10 DIAGNOSIS — D124 Benign neoplasm of descending colon: Secondary | ICD-10-CM | POA: Diagnosis not present

## 2022-02-10 DIAGNOSIS — R1319 Other dysphagia: Secondary | ICD-10-CM | POA: Diagnosis not present

## 2022-02-10 DIAGNOSIS — K317 Polyp of stomach and duodenum: Secondary | ICD-10-CM | POA: Diagnosis not present

## 2022-02-10 DIAGNOSIS — Z86010 Personal history of colonic polyps: Secondary | ICD-10-CM | POA: Diagnosis not present

## 2022-02-10 DIAGNOSIS — I10 Essential (primary) hypertension: Secondary | ICD-10-CM | POA: Diagnosis not present

## 2022-02-10 DIAGNOSIS — E785 Hyperlipidemia, unspecified: Secondary | ICD-10-CM | POA: Diagnosis not present

## 2022-02-10 DIAGNOSIS — F419 Anxiety disorder, unspecified: Secondary | ICD-10-CM | POA: Diagnosis not present

## 2022-02-10 DIAGNOSIS — Z860101 Personal history of adenomatous and serrated colon polyps: Secondary | ICD-10-CM

## 2022-02-10 DIAGNOSIS — Z09 Encounter for follow-up examination after completed treatment for conditions other than malignant neoplasm: Secondary | ICD-10-CM

## 2022-02-10 MED ORDER — SODIUM CHLORIDE 0.9 % IV SOLN: 500.0000 mL | Freq: Once | INTRAVENOUS | Status: DC

## 2022-02-10 NOTE — Op Note (Signed)
[facility] Patient Name: Grace Mccann Procedure Date: 02/10/2022 7:37 AM MRN: [id number] Endoscopist: Gatha Mayer , MD, [id number] Age: 81 Referring MD:  Date of Birth: [birth year] Gender: Female Account #: [id number] Procedure:                Upper GI endoscopy Indications:              Dysphagia Medicines:                Monitored Anesthesia Care Procedure:                Pre-Anesthesia Assessment:                           - Prior to the procedure, a History and Physical                            was performed, and patient medications and                            allergies were reviewed. The patient's tolerance of                            previous anesthesia was also reviewed. The risks                            and benefits of the procedure and the sedation                            options and risks were discussed with the patient.                            All questions were answered, and informed consent                            was obtained. Prior Anticoagulants: The patient has                            taken no anticoagulant or antiplatelet agents. ASA                            Grade Assessment: II - A patient with mild systemic                            disease. After reviewing the risks and benefits,                            the patient was deemed in satisfactory condition to                            undergo the procedure.                           After obtaining informed consent, the endoscope was  passed under direct vision. Throughout the                            procedure, the patient's blood pressure, pulse, and                            oxygen saturations were monitored continuously. The                            Endoscope was introduced through the mouth, and                            advanced to the second part of duodenum. The upper                            GI endoscopy was accomplished without  difficulty.                            The patient tolerated the procedure well. Scope In: Scope Out: Findings:                 No endoscopic abnormality was evident in the                            esophagus to explain the patient's complaint of                            dysphagia. It was decided, however, to proceed with                            dilation of the entire esophagus. The scope was                            withdrawn. Dilation was performed with a Maloney                            dilator with mild resistance at 36 Fr. The dilation                            site was examined following endoscope reinsertion                            and showed mild mucosal disruption. Estimated blood                            loss was minimal.                           Multiple 3 to 7 mm sessile polyps were found in the                            gastric fundus and in the gastric body. Biopsies  were taken with a cold forceps for histology.                            Verification of patient identification for the                            specimen was done. Estimated blood loss was minimal.                           The cardia and gastric fundus were normal on                            retroflexion.                           The exam was otherwise without abnormality. Complications:            No immediate complications. Estimated Blood Loss:     Estimated blood loss was minimal. Impression:               - No endoscopic esophageal abnormality to explain                            patient's dysphagia. Esophagus dilated. Dilated 51                            Fr Maloney and there was a slight mucosal                            disruption in area of upper esophageal sphincter                           - Multiple gastric polyps. Biopsied. These have                            classic appearance of innocent fundic gland poolyps                            and  likely need no follow-up.                           - The examination was otherwise normal. Recommendation:           - Patient has a contact number available for                            emergencies. The signs and symptoms of potential                            delayed complications were discussed with the                            patient. Return to normal activities tomorrow.                            Written  discharge instructions were provided to the                            patient.                           - Clear liquids x 1 hour then soft foods rest of                            day. Start prior diet tomorrow.                           - Continue present medications.                           - Await pathology results.                           - See the other procedure note for documentation of                            additional recommendations. Gatha Mayer, MD 02/10/2022 8:50:01 AM This report has been signed electronically.

## 2022-02-10 NOTE — Patient Instructions (Signed)
I dilated the esophagus - I think that will help you swallow better.  There were some small stomach polyps seen - they look like innocent fundic gland polyps. I took biopsies to check - I doubt we will need to do anything more.  In the colon there were 2 small polyps removed - they look benign. I will let you know what they were.   You also have a condition called diverticulosis - common and not usually a problem. Please read the handout provided.  No more routine colonoscopy - only if problems warrant.  Please follow special diet instructions and resume medications.  I appreciate the opportunity to care for you. Gatha Mayer, MD, [facility]  Handout on post dilation diet, diverticulosis and polyps provided   YOU HAD AN ENDOSCOPIC PROCEDURE TODAY AT [facility]:   Refer to the procedure report that was given to you for any specific questions about what was found during the examination.  If the procedure report does not answer your questions, please call your gastroenterologist to clarify.  If you requested that your care partner not be given the details of your procedure findings, then the procedure report has been included in a sealed envelope for you to review at your convenience later.  YOU SHOULD EXPECT: Some feelings of bloating in the abdomen. Passage of more gas than usual.  Walking can help get rid of the air that was put into your GI tract during the procedure and reduce the bloating. If you had a lower endoscopy (such as a colonoscopy or flexible sigmoidoscopy) you may notice spotting of blood in your stool or on the toilet paper. If you underwent a bowel prep for your procedure, you may not have a normal bowel movement for a few days.  Please Note:  You might notice some irritation and congestion in your nose or some drainage.  This is from the oxygen used during your procedure.  There is no need for concern and it should clear up in a day or so.  SYMPTOMS TO REPORT  IMMEDIATELY:  Following lower endoscopy (colonoscopy or flexible sigmoidoscopy):  Excessive amounts of blood in the stool  Significant tenderness or worsening of abdominal pains  Swelling of the abdomen that is new, acute  Fever of 100F or higher  Following upper endoscopy (EGD)  Vomiting of blood or coffee ground material  New chest pain or pain under the shoulder blades  Painful or persistently difficult swallowing  New shortness of breath  Fever of 100F or higher  Black, tarry-looking stools  For urgent or emergent issues, a gastroenterologist can be reached at any hour by calling [phone]. Do not use MyChart messaging for urgent concerns.    DIET: Clear liquid diet for one hour, until  9:12 am. Soft diet starting at 9:12 am  until tomorrow. See post dilation diet handout for recommendations. Drink plenty of fluids but you should avoid alcoholic beverages for 24 hours   ACTIVITY:  You should plan to take it easy for the rest of today and you should NOT DRIVE or use heavy machinery until tomorrow (because of the sedation medicines used during the test).    FOLLOW UP: Our staff will call the number listed on your records the next business day following your procedure.  We will call around 7:15- 8:00 am to check on you and address any questions or concerns that you may have regarding the information given to you following your procedure. If we do  not reach you, we will leave a message.     If any biopsies were taken you will be contacted by phone or by letter within the next 1-3 weeks.  Please call us at [phone] if you have not heard about the biopsies in 3 weeks.    SIGNATURES/CONFIDENTIALITY: You and/or your care partner have signed paperwork which will be entered into your electronic medical record.  These signatures attest to the fact that that the information above on your After Visit Summary has been reviewed and is understood.  Full responsibility of the  confidentiality of this discharge information lies with you and/or your care-partner.

## 2022-02-10 NOTE — Progress Notes (Signed)
Pt's states no medical or surgical changes since previsit or office visit. 

## 2022-02-10 NOTE — Progress Notes (Signed)
Report to pacu rn. Vss. Care resumed by rn. 

## 2022-02-10 NOTE — Progress Notes (Signed)
Called to room to assist during endoscopic procedure.  Patient ID and intended procedure confirmed with present staff. Received instructions for my participation in the procedure from the performing physician.  

## 2022-02-10 NOTE — Op Note (Signed)
[facility] Patient Name: Grace Mccann Procedure Date: 02/10/2022 7:26 AM MRN: [id number] Endoscopist: Gatha Mayer , MD, [id number] Age: 81 Referring MD:  Date of Birth: 1940/08/16 Gender: Female Account #: [id number] Procedure:                Colonoscopy Indications:              Surveillance: Personal history of adenomatous                            polyps on last colonoscopy > 3 years ago, Last                            colonoscopy: 2019 Medicines:                Monitored Anesthesia Care Procedure:                Pre-Anesthesia Assessment:                           - Prior to the procedure, a History and Physical                            was performed, and patient medications and                            allergies were reviewed. The patient's tolerance of                            previous anesthesia was also reviewed. The risks                            and benefits of the procedure and the sedation                            options and risks were discussed with the patient.                            All questions were answered, and informed consent                            was obtained. Prior Anticoagulants: The patient has                            taken no anticoagulant or antiplatelet agents. ASA                            Grade Assessment: II - A patient with mild systemic                            disease. After reviewing the risks and benefits,                            the patient was deemed in satisfactory condition to  undergo the procedure.                           After obtaining informed consent, the colonoscope                            was passed under direct vision. Throughout the                            procedure, the patient's blood pressure, pulse, and                            oxygen saturations were monitored continuously. The                            PCF-HQ190L Colonoscope was introduced through  the                            anus and advanced to the the cecum, identified by                            appendiceal orifice and ileocecal valve. The                            colonoscopy was somewhat difficult due to                            significant looping. Successful completion of the                            procedure was aided by straightening and shortening                            the scope to obtain bowel loop reduction and                            applying abdominal pressure. The patient tolerated                            the procedure well. The quality of the bowel                            preparation was good. The bowel preparation used                            was Miralax via split dose instruction. The                            ileocecal valve, appendiceal orifice, and rectum                            were photographed. Scope In: 8:15:28 AM Scope Out: 8:40:04 AM Scope Withdrawal Time: 0 hours 17 minutes 8 seconds  Total Procedure Duration: 0 hours 24 minutes 36 seconds  Findings:                 The perianal and digital rectal examinations were                            normal.                           Two sessile polyps were found in the descending                            colon. The polyps were 4 to 6 mm in size. These                            polyps were removed with a cold snare. Resection                            and retrieval were complete. Verification of                            patient identification for the specimen was done.                            Estimated blood loss was minimal.                           Multiple diverticula were found in the sigmoid                            colon and ascending colon.                           The exam was otherwise without abnormality on                            direct and retroflexion views. Complications:            No immediate complications. Estimated Blood Loss:     Estimated  blood loss was minimal. Impression:               - Two 4 to 6 mm polyps in the descending colon,                            removed with a cold snare. Resected and retrieved.                           - Diverticulosis in the sigmoid colon and in the                            ascending colon.                           - The examination was otherwise normal on direct  and retroflexion views.                           - Personal history of colonic polyps                           05/2014 - 8 adenomas max 15-20 mm                           10/2017 6 adenomas - small . Recommendation:           - Patient has a contact number available for                            emergencies. The signs and symptoms of potential                            delayed complications were discussed with the                            patient. Return to normal activities tomorrow.                            Written discharge instructions were provided to the                            patient.                           - Clear liquids x 1 hour then soft foods rest of                            day. Start prior diet tomorrow.                           - Continue present medications.                           - No repeat colonoscopy due to age.                           - Await pathology results. Gatha Mayer, MD 02/10/2022 8:55:00 AM This report has been signed electronically.

## 2022-02-11 ENCOUNTER — Telehealth: Payer: Self-pay | Admitting: *Deleted

## 2022-02-11 NOTE — Telephone Encounter (Signed)
  Follow up Call-     02/10/2022    7:10 AM  Call back number  Post procedure Call Back phone  # [phone]  Permission to leave phone message Yes     Patient questions:  Do you have a fever, pain , or abdominal swelling? No. Pain Score  0 *  Have you tolerated food without any problems? Yes.    Have you been able to return to your normal activities? Yes.    Do you have any questions about your discharge instructions: Diet   No. Medications  No. Follow up visit  No.  Do you have questions or concerns about your Care? No.  Actions: * If pain score is 4 or above: No action needed, pain <4.

## 2022-02-16 ENCOUNTER — Encounter: Payer: Self-pay | Admitting: Internal Medicine

## 2022-02-26 DIAGNOSIS — H02834 Dermatochalasis of left upper eyelid: Secondary | ICD-10-CM | POA: Diagnosis not present

## 2022-02-26 DIAGNOSIS — H1045 Other chronic allergic conjunctivitis: Secondary | ICD-10-CM | POA: Diagnosis not present

## 2022-02-26 DIAGNOSIS — H43811 Vitreous degeneration, right eye: Secondary | ICD-10-CM | POA: Diagnosis not present

## 2022-02-26 DIAGNOSIS — H02831 Dermatochalasis of right upper eyelid: Secondary | ICD-10-CM | POA: Diagnosis not present

## 2022-02-26 DIAGNOSIS — H04123 Dry eye syndrome of bilateral lacrimal glands: Secondary | ICD-10-CM | POA: Diagnosis not present

## 2022-02-26 DIAGNOSIS — H11822 Conjunctivochalasis, left eye: Secondary | ICD-10-CM | POA: Diagnosis not present

## 2022-02-26 DIAGNOSIS — Z961 Presence of intraocular lens: Secondary | ICD-10-CM | POA: Diagnosis not present

## 2022-04-01 DIAGNOSIS — E785 Hyperlipidemia, unspecified: Secondary | ICD-10-CM | POA: Diagnosis not present

## 2022-04-01 DIAGNOSIS — I10 Essential (primary) hypertension: Secondary | ICD-10-CM | POA: Diagnosis not present

## 2022-04-01 DIAGNOSIS — R7301 Impaired fasting glucose: Secondary | ICD-10-CM | POA: Diagnosis not present

## 2022-04-01 DIAGNOSIS — M8580 Other specified disorders of bone density and structure, unspecified site: Secondary | ICD-10-CM | POA: Diagnosis not present

## 2022-04-01 DIAGNOSIS — R7989 Other specified abnormal findings of blood chemistry: Secondary | ICD-10-CM | POA: Diagnosis not present

## 2022-04-08 DIAGNOSIS — Z0000 Encounter for general adult medical examination without abnormal findings: Secondary | ICD-10-CM | POA: Diagnosis not present

## 2022-04-08 DIAGNOSIS — N1831 Chronic kidney disease, stage 3a: Secondary | ICD-10-CM | POA: Diagnosis not present

## 2022-04-08 DIAGNOSIS — G720 Drug-induced myopathy: Secondary | ICD-10-CM | POA: Diagnosis not present

## 2022-04-08 DIAGNOSIS — R82998 Other abnormal findings in urine: Secondary | ICD-10-CM | POA: Diagnosis not present

## 2022-04-08 DIAGNOSIS — Z1339 Encounter for screening examination for other mental health and behavioral disorders: Secondary | ICD-10-CM | POA: Diagnosis not present

## 2022-04-08 DIAGNOSIS — I6523 Occlusion and stenosis of bilateral carotid arteries: Secondary | ICD-10-CM | POA: Diagnosis not present

## 2022-04-08 DIAGNOSIS — F1320 Sedative, hypnotic or anxiolytic dependence, uncomplicated: Secondary | ICD-10-CM | POA: Diagnosis not present

## 2022-04-08 DIAGNOSIS — E785 Hyperlipidemia, unspecified: Secondary | ICD-10-CM | POA: Diagnosis not present

## 2022-04-08 DIAGNOSIS — M8580 Other specified disorders of bone density and structure, unspecified site: Secondary | ICD-10-CM | POA: Diagnosis not present

## 2022-04-08 DIAGNOSIS — R7301 Impaired fasting glucose: Secondary | ICD-10-CM | POA: Diagnosis not present

## 2022-04-08 DIAGNOSIS — I129 Hypertensive chronic kidney disease with stage 1 through stage 4 chronic kidney disease, or unspecified chronic kidney disease: Secondary | ICD-10-CM | POA: Diagnosis not present

## 2022-04-08 DIAGNOSIS — Z1331 Encounter for screening for depression: Secondary | ICD-10-CM | POA: Diagnosis not present

## 2022-04-09 ENCOUNTER — Other Ambulatory Visit: Payer: Self-pay | Admitting: Internal Medicine

## 2022-04-09 DIAGNOSIS — E785 Hyperlipidemia, unspecified: Secondary | ICD-10-CM

## 2022-05-12 ENCOUNTER — Ambulatory Visit
Admission: RE | Admit: 2022-05-12 | Discharge: 2022-05-12 | Disposition: A | Discharge status: Home / Self Care | Payer: Medicare PPO | Source: Ambulatory Visit | Attending: Internal Medicine | Admitting: Internal Medicine

## 2022-05-12 DIAGNOSIS — I700 Atherosclerosis of aorta: Secondary | ICD-10-CM | POA: Diagnosis not present

## 2022-05-12 DIAGNOSIS — E785 Hyperlipidemia, unspecified: Secondary | ICD-10-CM

## 2022-06-02 DIAGNOSIS — N1831 Chronic kidney disease, stage 3a: Secondary | ICD-10-CM | POA: Diagnosis not present

## 2022-06-02 DIAGNOSIS — R931 Abnormal findings on diagnostic imaging of heart and coronary circulation: Secondary | ICD-10-CM | POA: Diagnosis not present

## 2022-06-02 DIAGNOSIS — G720 Drug-induced myopathy: Secondary | ICD-10-CM | POA: Diagnosis not present

## 2022-06-02 DIAGNOSIS — E785 Hyperlipidemia, unspecified: Secondary | ICD-10-CM | POA: Diagnosis not present

## 2022-06-02 DIAGNOSIS — I10 Essential (primary) hypertension: Secondary | ICD-10-CM | POA: Diagnosis not present

## 2022-08-12 DIAGNOSIS — M170 Bilateral primary osteoarthritis of knee: Secondary | ICD-10-CM | POA: Diagnosis not present

## 2022-11-04 DIAGNOSIS — Z1231 Encounter for screening mammogram for malignant neoplasm of breast: Secondary | ICD-10-CM | POA: Diagnosis not present

## 2022-11-12 DIAGNOSIS — M170 Bilateral primary osteoarthritis of knee: Secondary | ICD-10-CM | POA: Diagnosis not present

## 2023-03-01 DIAGNOSIS — H02831 Dermatochalasis of right upper eyelid: Secondary | ICD-10-CM | POA: Diagnosis not present

## 2023-03-01 DIAGNOSIS — Z961 Presence of intraocular lens: Secondary | ICD-10-CM | POA: Diagnosis not present

## 2023-03-01 DIAGNOSIS — H43811 Vitreous degeneration, right eye: Secondary | ICD-10-CM | POA: Diagnosis not present

## 2023-03-01 DIAGNOSIS — H02834 Dermatochalasis of left upper eyelid: Secondary | ICD-10-CM | POA: Diagnosis not present

## 2023-03-01 DIAGNOSIS — H04123 Dry eye syndrome of bilateral lacrimal glands: Secondary | ICD-10-CM | POA: Diagnosis not present

## 2023-03-01 DIAGNOSIS — H11822 Conjunctivochalasis, left eye: Secondary | ICD-10-CM | POA: Diagnosis not present

## 2023-03-01 DIAGNOSIS — H1045 Other chronic allergic conjunctivitis: Secondary | ICD-10-CM | POA: Diagnosis not present

## 2023-04-04 DIAGNOSIS — M170 Bilateral primary osteoarthritis of knee: Secondary | ICD-10-CM | POA: Diagnosis not present

## 2023-04-13 DIAGNOSIS — N1831 Chronic kidney disease, stage 3a: Secondary | ICD-10-CM | POA: Diagnosis not present

## 2023-04-13 DIAGNOSIS — R7301 Impaired fasting glucose: Secondary | ICD-10-CM | POA: Diagnosis not present

## 2023-04-13 DIAGNOSIS — I129 Hypertensive chronic kidney disease with stage 1 through stage 4 chronic kidney disease, or unspecified chronic kidney disease: Secondary | ICD-10-CM | POA: Diagnosis not present

## 2023-04-13 DIAGNOSIS — E785 Hyperlipidemia, unspecified: Secondary | ICD-10-CM | POA: Diagnosis not present

## 2023-04-13 DIAGNOSIS — M8580 Other specified disorders of bone density and structure, unspecified site: Secondary | ICD-10-CM | POA: Diagnosis not present

## 2023-04-19 DIAGNOSIS — B351 Tinea unguium: Secondary | ICD-10-CM | POA: Diagnosis not present

## 2023-04-19 DIAGNOSIS — M79676 Pain in unspecified toe(s): Secondary | ICD-10-CM | POA: Diagnosis not present

## 2023-04-20 DIAGNOSIS — Z0000 Encounter for general adult medical examination without abnormal findings: Secondary | ICD-10-CM | POA: Diagnosis not present

## 2023-04-20 DIAGNOSIS — I129 Hypertensive chronic kidney disease with stage 1 through stage 4 chronic kidney disease, or unspecified chronic kidney disease: Secondary | ICD-10-CM | POA: Diagnosis not present

## 2023-04-20 DIAGNOSIS — F419 Anxiety disorder, unspecified: Secondary | ICD-10-CM | POA: Diagnosis not present

## 2023-04-20 DIAGNOSIS — I6523 Occlusion and stenosis of bilateral carotid arteries: Secondary | ICD-10-CM | POA: Diagnosis not present

## 2023-04-20 DIAGNOSIS — N1831 Chronic kidney disease, stage 3a: Secondary | ICD-10-CM | POA: Diagnosis not present

## 2023-04-20 DIAGNOSIS — I10 Essential (primary) hypertension: Secondary | ICD-10-CM | POA: Diagnosis not present

## 2023-04-20 DIAGNOSIS — R931 Abnormal findings on diagnostic imaging of heart and coronary circulation: Secondary | ICD-10-CM | POA: Diagnosis not present

## 2023-04-20 DIAGNOSIS — Z1339 Encounter for screening examination for other mental health and behavioral disorders: Secondary | ICD-10-CM | POA: Diagnosis not present

## 2023-04-20 DIAGNOSIS — F1320 Sedative, hypnotic or anxiolytic dependence, uncomplicated: Secondary | ICD-10-CM | POA: Diagnosis not present

## 2023-04-20 DIAGNOSIS — N3941 Urge incontinence: Secondary | ICD-10-CM | POA: Diagnosis not present

## 2023-04-20 DIAGNOSIS — Z1331 Encounter for screening for depression: Secondary | ICD-10-CM | POA: Diagnosis not present

## 2023-04-20 DIAGNOSIS — Z23 Encounter for immunization: Secondary | ICD-10-CM | POA: Diagnosis not present

## 2023-04-20 DIAGNOSIS — G720 Drug-induced myopathy: Secondary | ICD-10-CM | POA: Diagnosis not present

## 2023-04-20 DIAGNOSIS — Z860101 Personal history of adenomatous and serrated colon polyps: Secondary | ICD-10-CM | POA: Diagnosis not present

## 2023-07-11 NOTE — Progress Notes (Signed)
 " Cardiology Office Note:  .   Date:  07/13/2023 ID:  Grace Mccann, DOB November 19, 1940, MRN [id number] PCP: Loreli Elsie [name], MD [facility] Providers Cardiologist:  None   Patient Profile: .      PMH Hyperlipidemia Coronary artery disease CT Calcium  Score 05/11/22 CAC Score 429 (74th percentile) LM 361, LAD 0, LCx 29.4, RCA 38.4 Aortic atherosclerosis Hypertension Carotid artery disease Carotid duplex 01/2020 Bilateral 1-39% stenosis CKD Stage IIIa Statin myalgia  Previously seen by Dr. Delford for hyperlipidemia management given statin intolerance.  She had a normal Myoview in 2016.       History of Present Illness: .    History of Present Illness Grace Mccann is a very pleasant 83 year old female who presents for consultation of elevated coronary calcium  score. She reports she was previously more active, but lately all she wants to do is sit and read or sleep. She does some light housekeeping, somewhat limited by back and knee pain. She experiences lightheadedness upon standing quickly and mild exertional dyspnea when walking uphill. No recent chest pain, chest tightness, significant leg swelling, orthopnea, PND, presyncope or syncope. Reports occasional chest discomfort which she attributes to indigestion and which is relieved by Tums. She started Repatha approximately 1 year ago and has tolerated it without concerning side effects. No history of smoking.  No significant family history of heart disease.  Her father did have MI at age 83. One brief episode of palpitations several years ago. back and knee pain. Her diet includes an egg and cheese sandwich for breakfast, snacks for lunch, and dinners often eaten out, typically consisting of a 'meat and three vegetables' meal or a hamburger.    Family history: Her family history includes Alzheimer's disease in her father; Breast cancer in her mother; Colon polyps in her son.  Father had MI around age 83 No problems in  sister  Discussed the use of AI scribe software for clinical note transcription with the patient, who gave verbal consent to proceed.   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   No results found for: LIPOA   ROS: See HPI       Studies Reviewed: SABRA   EKG Interpretation Date/Time:  Wednesday Jul 13 2023 14:25:38 EDT Ventricular Rate:  100 PR Interval:    QRS Duration:  122 QT Interval:  398 QTC Calculation: 513 R Axis:   -22  Text Interpretation: Sinus rhythm with Premature atrial complexes Right bundle branch block When compared with ECG of 16-Jan-2015 09:48, Premature atrial complexes are now Present Right bundle branch block is now Present Confirmed by Percy Browning [phone]) on 07/13/2023 2:51:25 PM      Risk Assessment/Calculations:             Physical Exam:   VS: BP 114/72   Pulse 91   Ht 5' 6 (1.676 m)   Wt 211 lb 4.8 oz (95.8 kg)   SpO2 94%   BMI 34.10 kg/m   Wt Readings from Last 3 Encounters:  07/13/23 211 lb 4.8 oz (95.8 kg)  02/10/22 212 lb (96.2 kg)  12/29/21 212 lb 6 oz (96.3 kg)     GEN: Well nourished, well developed in no acute distress NECK: No JVD; No carotid bruits CARDIAC: Irregular RR, no murmurs, rubs, gallops RESPIRATORY:  Clear to auscultation  without rales, wheezing or rhonchi  ABDOMEN: Soft, non-tender, non-distended EXTREMITIES:  No edema; No deformity     ASSESSMENT AND PLAN: .    Assessment & Plan Coronary artery disease CT calcium  score 429 (74th percentile) with focal calcification in left main artery.  She is asymptomatic but reports decreased energy and exertional dyspnea walking up her driveway which is on an incline.  Due to severe atherosclerotic burden along with additional risk factors of age, hyperlipidemia, and hypertension, we will get coronary CTA for further  ischemia evaluation. Advised LDL goal < 55, which she has achieved. BP is well controlled. No bleeding concerns, continue aspirin . Encouraged more heart healthy diet and aiming for at least 150 minutes of moderate intensity exercise each week.   Right bundle branch block   Newly identified on EKG, requiring further evaluation. Increasing fatigue over the past year or more and some dyspnea with minimal exertion. We will get echocardiogram to assess heart and valve function.   PACs PACs noted on EKG which she states have been present for several years. No significant palpitations. We are getting CT for ischemia evaluation as well as echo to monitor her heart and valve function.  Carotid artery stenosis   Carotid duplex 01/2020 with 1 to 39% stenosis bilaterally. She is asymptomatic. No indication for further testing at this time.   Hyperlipidemia LDL goal < 55/Statin intolerance Lipid panel completed 04/13/2023 with total cholesterol 148, HDL 62, LDL 44, and triglycerides 789.  She has been on Repatha for approximately 1 year and is tolerating well. Encouraged substituting high fat/high carbohydrate foods for fruits, vegetables and whole grains. Continue Repatha and ezetimibe.         Disposition: 3-4 months with me  Signed, Rosaline Bane, NP-C "

## 2023-07-12 DIAGNOSIS — I70203 Unspecified atherosclerosis of native arteries of extremities, bilateral legs: Secondary | ICD-10-CM | POA: Diagnosis not present

## 2023-07-12 DIAGNOSIS — M79675 Pain in left toe(s): Secondary | ICD-10-CM | POA: Diagnosis not present

## 2023-07-12 DIAGNOSIS — B351 Tinea unguium: Secondary | ICD-10-CM | POA: Diagnosis not present

## 2023-07-12 DIAGNOSIS — M79674 Pain in right toe(s): Secondary | ICD-10-CM | POA: Diagnosis not present

## 2023-07-12 DIAGNOSIS — L84 Corns and callosities: Secondary | ICD-10-CM | POA: Diagnosis not present

## 2023-07-13 ENCOUNTER — Ambulatory Visit (HOSPITAL_BASED_OUTPATIENT_CLINIC_OR_DEPARTMENT_OTHER): Admitting: Nurse Practitioner

## 2023-07-13 ENCOUNTER — Encounter (HOSPITAL_BASED_OUTPATIENT_CLINIC_OR_DEPARTMENT_OTHER): Payer: Self-pay | Admitting: Nurse Practitioner

## 2023-07-13 VITALS — BP 114/72 | HR 91 | Ht 66.0 in | Wt 211.3 lb

## 2023-07-13 DIAGNOSIS — I4510 Unspecified right bundle-branch block: Secondary | ICD-10-CM

## 2023-07-13 DIAGNOSIS — T466X5D Adverse effect of antihyperlipidemic and antiarteriosclerotic drugs, subsequent encounter: Secondary | ICD-10-CM | POA: Diagnosis not present

## 2023-07-13 DIAGNOSIS — R931 Abnormal findings on diagnostic imaging of heart and coronary circulation: Secondary | ICD-10-CM | POA: Diagnosis not present

## 2023-07-13 DIAGNOSIS — E785 Hyperlipidemia, unspecified: Secondary | ICD-10-CM | POA: Diagnosis not present

## 2023-07-13 DIAGNOSIS — M791 Myalgia, unspecified site: Secondary | ICD-10-CM | POA: Diagnosis not present

## 2023-07-13 DIAGNOSIS — I2510 Atherosclerotic heart disease of native coronary artery without angina pectoris: Secondary | ICD-10-CM | POA: Diagnosis not present

## 2023-07-13 DIAGNOSIS — I491 Atrial premature depolarization: Secondary | ICD-10-CM | POA: Diagnosis not present

## 2023-07-13 DIAGNOSIS — I6523 Occlusion and stenosis of bilateral carotid arteries: Secondary | ICD-10-CM

## 2023-07-13 MED ORDER — METOPROLOL TARTRATE 50 MG PO TABS
ORAL_TABLET | ORAL | 0 refills | Status: DC
Start: 2023-07-13 — End: 2023-08-09

## 2023-07-13 NOTE — Patient Instructions (Addendum)
 Mediterranean Diet  Why follow it? Research shows. Those who follow the Mediterranean diet have a reduced risk of heart disease  The diet is associated with a reduced incidence of Parkinson's and Alzheimer's diseases People following the diet may have longer life expectancies and lower rates of chronic diseases  The Dietary Guidelines for Americans recommends the Mediterranean diet as an eating plan to promote health and prevent disease  What Is the Mediterranean Diet?  Healthy eating plan based on typical foods and recipes of Mediterranean-style cooking The diet is primarily a plant based diet; these foods should make up a majority of meals   Starches - Plant based foods should make up a majority of meals - They are an important sources of vitamins, minerals, energy, antioxidants, and fiber - Choose whole grains, foods high in fiber and minimally processed items  - Typical grain sources include wheat, oats, barley, corn, brown rice, bulgar, farro, millet, polenta, couscous  - Various types of beans include chickpeas, lentils, fava beans, black beans, white beans   Fruits  Veggies - Large quantities of antioxidant rich fruits & veggies; 6 or more servings  - Vegetables can be eaten raw or lightly drizzled with oil and cooked  - Vegetables common to the traditional Mediterranean Diet include: artichokes, arugula, beets, broccoli, brussel sprouts, cabbage, carrots, celery, collard greens, cucumbers, eggplant, kale, leeks, lemons, lettuce, mushrooms, okra, onions, peas, peppers, potatoes, pumpkin, radishes, rutabaga, shallots, spinach, sweet potatoes, turnips, zucchini - Fruits common to the Mediterranean Diet include: apples, apricots, avocados, cherries, clementines, dates, figs, grapefruits, grapes, melons, nectarines, oranges, peaches, pears, pomegranates, strawberries, tangerines  Fats - Replace butter and margarine with healthy oils, such as olive oil, canola oil, and tahini  - Limit  nuts to no more than a handful a day  - Nuts include walnuts, almonds, pecans, pistachios, pine nuts  - Limit or avoid candied, honey roasted or heavily salted nuts - Olives are central to the [employer] - can be eaten whole or used in a variety of dishes   Meats Protein - Limiting red meat: no more than a few times a month - When eating red meat: choose lean cuts and keep the portion to the size of deck of cards - Eggs: approx. 0 to 4 times a week  - Fish and lean poultry: at least 2 a week  - Healthy protein sources include, chicken, [place], lean beef, lamb - Increase intake of seafood such as tuna, salmon, trout, mackerel, shrimp, scallops - Avoid or limit high fat processed meats such as sausage and bacon  Dairy - Include moderate amounts of low fat dairy products  - Focus on healthy dairy such as fat free yogurt, skim milk, low or reduced fat cheese - Limit dairy products higher in fat such as whole or 2% milk, cheese, ice cream  Alcohol  - Moderate amounts of red wine is ok  - No more than 5 oz daily for women (all ages) and men older than age 36  - No more than 10 oz of wine daily for men younger than 61  Other - Limit sweets and other desserts  - Use herbs and spices instead of salt to flavor foods  - Herbs and spices common to the traditional Mediterranean Diet include: basil, bay leaves, chives, cloves, cumin, fennel, garlic, lavender, marjoram, mint, oregano, parsley, pepper, rosemary, sage, savory, sumac, tarragon, thyme   It's not just a diet, it's a lifestyle:  The Mediterranean diet includes lifestyle factors typical of  those in the region  Foods, drinks and meals are best eaten with others and savored Daily physical activity is important for overall good health This could be strenuous exercise like running and aerobics This could also be more leisurely activities such as walking, housework, yard-work, or taking the stairs Moderation is the key; a balanced and healthy  diet accommodates most foods and drinks Consider portion sizes and frequency of consumption of certain foods   Meal Ideas & Options:  Breakfast:  Whole wheat toast or whole wheat English muffins with peanut butter & hard boiled egg Steel cut oats topped with apples & cinnamon and skim milk  Fresh fruit: banana, strawberries, melon, berries, peaches  Smoothies: strawberries, bananas, greek yogurt, peanut butter Low fat greek yogurt with blueberries and granola  Egg white omelet with spinach and mushrooms Breakfast couscous: whole wheat couscous, apricots, skim milk, cranberries  Sandwiches:  Hummus and grilled vegetables (peppers, zucchini, squash) on whole wheat bread   Grilled chicken on whole wheat pita with lettuce, tomatoes, cucumbers or tzatziki  [place] salad on whole wheat bread: tuna salad made with greek yogurt, olives, red peppers, capers, green onions Garlic rosemary lamb pita: lamb sauted with garlic, rosemary, salt & pepper; add lettuce, cucumber, greek yogurt to pita - flavor with lemon juice and black pepper  Seafood:  Mediterranean grilled salmon, seasoned with garlic, basil, parsley, lemon juice and black pepper Shrimp, lemon, and spinach whole-grain pasta salad made with low fat greek yogurt  Seared scallops with lemon orzo  Seared tuna steaks seasoned salt, pepper, coriander topped with tomato mixture of olives, tomatoes, olive oil, minced garlic, parsley, green onions and cappers  Meats:  Herbed greek chicken salad with kalamata olives, cucumber, feta  Red bell peppers stuffed with spinach, bulgur, lean ground beef (or lentils) & topped with feta   Kebabs: skewers of chicken, tomatoes, onions, zucchini, squash  [place] burgers: made with red onions, mint, dill, lemon juice, feta cheese topped with roasted red peppers Vegetarian Cucumber salad: cucumbers, artichoke hearts, celery, red onion, feta cheese, tossed in olive oil & lemon juice  Hummus and whole grain pita  points with a greek salad (lettuce, tomato, feta, olives, cucumbers, red onion) Lentil soup with celery, carrots made with vegetable broth, garlic, salt and pepper  Tabouli salad: parsley, bulgur, mint, scallions, cucumbers, tomato, radishes, lemon juice, olive oil, salt and pepper. Medication Instructions:   Your physician recommends that you continue on your current medications as directed. Please refer to the Current Medication list given to you today.   *If you need a refill on your cardiac medications before your next appointment, please call your pharmacy*  Lab Work:  None ordered.  If you have labs (blood work) drawn today and your tests are completely normal, you will receive your results only by: MyChart Message (if you have MyChart) OR A paper copy in the mail If you have any lab test that is abnormal or we need to change your treatment, we will call you to review the results.  Testing/Procedures:    Your cardiac CT will be scheduled at one of the below locations:   [facility] [address] [place], [place] [address] [phone]  If scheduled at [facility], please arrive at the [facility] (Entrance C2) of [facility] 30 minutes prior to test start time. You can use the FREE valet parking offered at entrance C (encouraged to control the heart rate for the test)  Proceed  to the [facility] Radiology Department (first floor) to check-in and test prep.   All radiology patients and guests should use entrance C2 at [facility], accessed from [facility], even though the hospital's physical address listed is [address]     Please follow these instructions carefully (unless otherwise directed):  An IV will be required for this test and Nitroglycerin will be given.    On the Night Before the Test: Be sure to Drink plenty of water. Do not consume any caffeinated/decaffeinated beverages  or chocolate 12 hours prior to your test. Do not take any antihistamines 12 hours prior to your test.  On the Day of the Test: Drink plenty of water until 1 hour prior to the test. Do not eat any food 1 hour prior to test. You may take your regular medications prior to the test.  Take metoprolol (Lopressor) take one (1) tablet by mouth ( 50 mg)  two hours prior to test. If you take Hydrochlorothiazide please HOLD on the morning of the test. FEMALES- please wear underwire-free bra if available, avoid dresses & tight clothing     After the Test: Drink plenty of water. After receiving IV contrast, you may experience a mild flushed feeling. This is normal. On occasion, you may experience a mild rash up to 24 hours after the test. This is not dangerous. If this occurs, you can take Benadryl 25 mg, Zyrtec, Claritin, or Allegra and increase your fluid intake. (Patients taking Tikosyn should avoid Benadryl, and may take Zyrtec, Claritin, or Allegra) If you experience trouble breathing, this can be serious. If it is severe call 911 IMMEDIATELY. If it is mild, please call our office.  We will call to schedule your test 2-4 weeks out understanding that some insurance companies will need an authorization prior to the service being performed.   For more information and frequently asked questions, please visit our website : [email or web address]  For non-scheduling related questions, please contact the cardiac imaging nurse navigator should you have any questions/concerns: Cardiac Imaging Nurse Navigators Direct Office Dial: [phone]   For scheduling needs, including cancellations and rescheduling, please call [place], [phone].   Your physician has requested that you have an echocardiogram. Echocardiography is a painless test that uses sound waves to create images of your heart. It provides your doctor with information about the size and shape of your heart and how well your  heart's chambers and valves are working. This procedure takes approximately one hour. There are no restrictions for this procedure. Please do NOT wear cologne, perfume, or lotions (deodorant is allowed). Please arrive 15 minutes prior to your appointment time.  Follow-Up: At [facility], you and your health needs are our priority.  As part of our continuing mission to provide you with exceptional heart care, our providers are all part of one team.  This team includes your primary Cardiologist (physician) and Advanced Practice Providers or APPs (Physician Assistants and Nurse Practitioners) who all work together to provide you with the care you need, when you need it.  Your next appointment:   4 month(s)  Provider:   Slater Duncan, NP    We recommend signing up for the patient portal called "MyChart".  Sign up information is provided on this After Visit Summary.  MyChart is used to connect with patients for Virtual Visits (Telemedicine).  Patients are able to view lab/test results, encounter notes, upcoming appointments, etc.  Non-urgent messages can be sent to your provider  as well.   To learn more about what you can do with MyChart, go to [email or web address].   Other Instructions

## 2023-08-02 ENCOUNTER — Encounter (HOSPITAL_COMMUNITY): Payer: Self-pay

## 2023-08-02 DIAGNOSIS — M170 Bilateral primary osteoarthritis of knee: Secondary | ICD-10-CM | POA: Diagnosis not present

## 2023-08-03 ENCOUNTER — Telehealth (HOSPITAL_COMMUNITY): Payer: Self-pay | Admitting: *Deleted

## 2023-08-03 NOTE — Telephone Encounter (Signed)

## 2023-08-04 ENCOUNTER — Ambulatory Visit (HOSPITAL_COMMUNITY)
Admission: RE | Admit: 2023-08-04 | Discharge: 2023-08-04 | Disposition: A | Discharge status: Home / Self Care | Source: Ambulatory Visit | Attending: Nurse Practitioner | Admitting: Nurse Practitioner

## 2023-08-04 DIAGNOSIS — R931 Abnormal findings on diagnostic imaging of heart and coronary circulation: Secondary | ICD-10-CM

## 2023-08-04 DIAGNOSIS — E785 Hyperlipidemia, unspecified: Secondary | ICD-10-CM | POA: Diagnosis not present

## 2023-08-04 DIAGNOSIS — I2510 Atherosclerotic heart disease of native coronary artery without angina pectoris: Secondary | ICD-10-CM | POA: Diagnosis not present

## 2023-08-04 DIAGNOSIS — I4510 Unspecified right bundle-branch block: Secondary | ICD-10-CM

## 2023-08-04 DIAGNOSIS — I700 Atherosclerosis of aorta: Secondary | ICD-10-CM | POA: Diagnosis not present

## 2023-08-04 MED ORDER — NITROGLYCERIN 0.4 MG SL SUBL
0.8000 mg | SUBLINGUAL_TABLET | Freq: Once | SUBLINGUAL | Status: AC
  Administered 2023-08-04: 0.8 mg via SUBLINGUAL

## 2023-08-04 MED ORDER — IOHEXOL 350 MG/ML SOLN
100.0000 mL | Freq: Once | INTRAVENOUS | Status: AC | PRN
  Administered 2023-08-04: 100 mL via INTRAVENOUS

## 2023-08-04 NOTE — Progress Notes (Signed)
 Patient presented for a cardiac CT. During the test, the IV extravasated on left AC area. The CT tech estimates about 50 ml of contrast was injected.  Patient reports mild pain with minimal edema at the injection site. Patient reports full range of motion and radial pulse was palpated as a +2.  Discussed with Dr. Mitzie Anda and he was agreeable with patient going home.  Pressure dressing applied and ice pack given to patient. Home instructions given to patient and when to seek medical attention. She verbalized understanding.

## 2023-08-05 ENCOUNTER — Ambulatory Visit (HOSPITAL_COMMUNITY)
Admission: RE | Admit: 2023-08-05 | Discharge: 2023-08-05 | Disposition: A | Discharge status: Home / Self Care | Source: Ambulatory Visit | Attending: Cardiology | Admitting: Cardiology

## 2023-08-05 ENCOUNTER — Telehealth (HOSPITAL_COMMUNITY): Payer: Self-pay | Admitting: *Deleted

## 2023-08-05 ENCOUNTER — Other Ambulatory Visit: Payer: Self-pay | Admitting: Cardiology

## 2023-08-05 DIAGNOSIS — I4510 Unspecified right bundle-branch block: Secondary | ICD-10-CM | POA: Diagnosis not present

## 2023-08-05 DIAGNOSIS — I2510 Atherosclerotic heart disease of native coronary artery without angina pectoris: Secondary | ICD-10-CM

## 2023-08-05 DIAGNOSIS — R931 Abnormal findings on diagnostic imaging of heart and coronary circulation: Secondary | ICD-10-CM | POA: Diagnosis not present

## 2023-08-05 DIAGNOSIS — E785 Hyperlipidemia, unspecified: Secondary | ICD-10-CM | POA: Diagnosis not present

## 2023-08-05 NOTE — Telephone Encounter (Signed)
 Calling patient to check on her injection site. She reports that her left arm is better than it was yesterday.  She states that it still feels "tight" but denies any pain.

## 2023-08-08 ENCOUNTER — Ambulatory Visit (HOSPITAL_BASED_OUTPATIENT_CLINIC_OR_DEPARTMENT_OTHER)

## 2023-08-08 ENCOUNTER — Ambulatory Visit (HOSPITAL_BASED_OUTPATIENT_CLINIC_OR_DEPARTMENT_OTHER): Payer: Self-pay | Admitting: Nurse Practitioner

## 2023-08-08 DIAGNOSIS — I4510 Unspecified right bundle-branch block: Secondary | ICD-10-CM | POA: Diagnosis not present

## 2023-08-08 DIAGNOSIS — E785 Hyperlipidemia, unspecified: Secondary | ICD-10-CM | POA: Diagnosis not present

## 2023-08-08 DIAGNOSIS — R931 Abnormal findings on diagnostic imaging of heart and coronary circulation: Secondary | ICD-10-CM | POA: Diagnosis not present

## 2023-08-08 LAB — ECHOCARDIOGRAM COMPLETE
AR max vel: 1.42 cm2
AV Area VTI: 1.49 cm2
AV Area mean vel: 1.33 cm2
AV Mean grad: 7 mmHg
AV Peak grad: 11.8 mmHg
Ao pk vel: 1.72 m/s
Area-P 1/2: 2.76 cm2
S' Lateral: 2.95 cm

## 2023-08-09 MED ORDER — METOPROLOL SUCCINATE ER 25 MG PO TB24: 25.0000 mg | ORAL_TABLET | Freq: Every day | ORAL | 3 refills | Status: DC

## 2023-09-20 DIAGNOSIS — L84 Corns and callosities: Secondary | ICD-10-CM | POA: Diagnosis not present

## 2023-09-20 DIAGNOSIS — M79674 Pain in right toe(s): Secondary | ICD-10-CM | POA: Diagnosis not present

## 2023-09-20 DIAGNOSIS — B351 Tinea unguium: Secondary | ICD-10-CM | POA: Diagnosis not present

## 2023-09-20 DIAGNOSIS — M79675 Pain in left toe(s): Secondary | ICD-10-CM | POA: Diagnosis not present

## 2023-09-20 DIAGNOSIS — I70203 Unspecified atherosclerosis of native arteries of extremities, bilateral legs: Secondary | ICD-10-CM | POA: Diagnosis not present

## 2023-11-10 DIAGNOSIS — Z1231 Encounter for screening mammogram for malignant neoplasm of breast: Secondary | ICD-10-CM | POA: Diagnosis not present

## 2023-11-21 ENCOUNTER — Encounter (HOSPITAL_BASED_OUTPATIENT_CLINIC_OR_DEPARTMENT_OTHER): Payer: Self-pay | Admitting: Nurse Practitioner

## 2023-11-21 ENCOUNTER — Ambulatory Visit (HOSPITAL_BASED_OUTPATIENT_CLINIC_OR_DEPARTMENT_OTHER): Admitting: Nurse Practitioner

## 2023-11-21 VITALS — BP 116/68 | HR 90 | Ht 66.0 in | Wt 213.3 lb

## 2023-11-21 DIAGNOSIS — M791 Myalgia, unspecified site: Secondary | ICD-10-CM | POA: Diagnosis not present

## 2023-11-21 DIAGNOSIS — I2510 Atherosclerotic heart disease of native coronary artery without angina pectoris: Secondary | ICD-10-CM

## 2023-11-21 DIAGNOSIS — R931 Abnormal findings on diagnostic imaging of heart and coronary circulation: Secondary | ICD-10-CM | POA: Diagnosis not present

## 2023-11-21 DIAGNOSIS — I10 Essential (primary) hypertension: Secondary | ICD-10-CM

## 2023-11-21 DIAGNOSIS — M25471 Effusion, right ankle: Secondary | ICD-10-CM | POA: Diagnosis not present

## 2023-11-21 DIAGNOSIS — I4510 Unspecified right bundle-branch block: Secondary | ICD-10-CM

## 2023-11-21 DIAGNOSIS — I6523 Occlusion and stenosis of bilateral carotid arteries: Secondary | ICD-10-CM

## 2023-11-21 DIAGNOSIS — E785 Hyperlipidemia, unspecified: Secondary | ICD-10-CM | POA: Diagnosis not present

## 2023-11-21 DIAGNOSIS — M25472 Effusion, left ankle: Secondary | ICD-10-CM

## 2023-11-21 DIAGNOSIS — T466X5A Adverse effect of antihyperlipidemic and antiarteriosclerotic drugs, initial encounter: Secondary | ICD-10-CM

## 2023-11-21 DIAGNOSIS — T466X5D Adverse effect of antihyperlipidemic and antiarteriosclerotic drugs, subsequent encounter: Secondary | ICD-10-CM | POA: Diagnosis not present

## 2023-11-21 MED ORDER — METOPROLOL SUCCINATE ER 25 MG PO TB24: 25.0000 mg | ORAL_TABLET | Freq: Every day | ORAL | Status: AC

## 2023-11-21 NOTE — Patient Instructions (Signed)
 Medication Instructions:   When you get home check to see if your are on Metoprolol .   HOLD Zetia for two weeks than I will call you to check to see if your arm pain went away.  *If you need a refill on your cardiac medications before your next appointment, please call your pharmacy*  Lab Work:  None ordered.  If you have labs (blood work) drawn today and your tests are completely normal, you will receive your results only by: MyChart Message (if you have MyChart) OR A paper copy in the mail If you have any lab test that is abnormal or we need to change your treatment, we will call you to review the results.  Testing/Procedures:  None ordered.  Follow-Up: At [facility], you and your health needs are our priority.  As part of our continuing mission to provide you with exceptional heart care, our providers are all part of one team.  This team includes your primary Cardiologist (physician) and Advanced Practice Providers or APPs (Physician Assistants and Nurse Practitioners) who all work together to provide you with the care you need, when you need it.  Your next appointment:   1 year(s)  Provider:   Rosaline Bane, NP    We recommend signing up for the patient portal called MyChart.  Sign up information is provided on this After Visit Summary.  MyChart is used to connect with patients for Virtual Visits (Telemedicine).  Patients are able to view lab/test results, encounter notes, upcoming appointments, etc.  Non-urgent messages can be sent to your provider as well.   To learn more about what you can do with MyChart, go to [email or web address].   Other Instructions  Your physician wants you to follow-up in: 1 year.  You will receive a reminder letter in the mail two months in advance. If you don't receive a letter, please call our office to schedule the follow-up appointment.  Adopting a Healthy Lifestyle.   Weight: Know what a healthy weight is for you (roughly  BMI <25) and aim to maintain this. You can calculate your body mass index on your smart phone. Unfortunately, this is not the most accurate measure of healthy weight, but it is the simplest measurement to use. A more accurate measurement involves body scanning which measures lean muscle, fat tissue and bony density. We do not have this equipment at [facility].    Diet: Aim for 7+ servings of fruits and vegetables daily Limit animal fats in diet for cholesterol and heart health - choose grass fed whenever available Avoid highly processed foods (fast food burgers, tacos, fried chicken, pizza, hot dogs, french fries)  Saturated fat comes in the form of butter, lard, coconut oil, margarine, partially hydrogenated oils, and fat in meat. These increase your risk of cardiovascular disease.  Use healthy plant oils, such as olive, canola, soy, corn, sunflower and peanut.  Whole foods such as fruits, vegetables and whole grains have fiber  Men need > 38 grams of fiber per day Women need > 25 grams of fiber per day  Load up on vegetables and fruits - one-half of your plate: Aim for color and variety, and remember that potatoes dont count. Go for whole grains - one-quarter of your plate: Whole wheat, barley, wheat berries, quinoa, oats, brown rice, and foods made with them. If you want pasta, go with whole wheat pasta. Protein power - one-quarter of your plate: Fish, chicken, beans, and nuts are all healthy, versatile protein sources. Limit red  meat. You need carbohydrates for energy! The type of carbohydrate is more important than the amount. Choose carbohydrates such as vegetables, fruits, whole grains, beans, and nuts in the place of white rice, white pasta, potatoes (baked or fried), macaroni and cheese, cakes, cookies, and donuts.  If youre thirsty, drink water. Coffee and tea are good in moderation, but skip sugary drinks and limit milk and dairy products to one or two daily servings. Keep sugar intake at  6 teaspoons or 24 grams or LESS       Exercise: Aim for 150 min of moderate intensity exercise weekly for heart health, and weights twice weekly for bone health Stay active - any steps are better than no steps! Aim for 7-9 hours of sleep daily

## 2023-11-21 NOTE — Progress Notes (Signed)
 Cardiology Office Note:  .   Date:  11/21/2023 ID:  Grace Mccann, DOB [birth year], MRN [id number] PCP: Grace Elsie [name], MD [facility] Providers Cardiologist:  None   Patient Profile: .      PMH Hyperlipidemia Coronary artery disease CT Calcium  Score 05/11/22 CAC Score 429 (74th percentile) LM 361, LAD 0, LCx 29.4, RCA 38.4 Aortic atherosclerosis Hypertension Carotid artery disease Carotid duplex 01/2020 Bilateral 1-39% stenosis CKD Stage IIIa Statin myalgia  Previously seen by Dr. Delford for hyperlipidemia management given statin intolerance.  She had a normal Myoview in 2016.  Seen by me on 07/13/23 for consultation of elevated coronary calcium  score. Was previously more active, but lately all she wants to do is sit and read or sleep. She does some light housekeeping, somewhat limited by back and knee pain. She experiences lightheadedness upon standing quickly and mild exertional dyspnea when walking uphill. No significant leg swelling, orthopnea, PND, presyncope or syncope. Occasional chest discomfort which she attributes to indigestion and which is relieved by Tums. She started Repatha approximately 1 year ago and has tolerated it without concerning side effects. No history of smoking.  No significant family history of heart disease.  Her father did have MI at age 63. One brief episode of palpitations several years ago. back and knee pain. Diet includes an egg and cheese sandwich for breakfast, snacks for lunch, and dinners often eaten out, typically consisting of a 'meat and three vegetables' meal or a hamburger.  She underwent coronary CTA for ischemia evaluation which revealed CAC score of 337 (60th percentile) severe total plaque volume, mild to moderate CAD with 50 to 69% calcified plaque at proximal LAD with no significant stenosis by CT FFR.  TTE 08/08/2023 revealed LVEF 50 to 55%, mild LVH, G2 DD, normal RV, mildly dilated LA, aortic valve sclerosis with no evidence of  stenosis.  She was started on metoprolol  succinate 25 mg daily in addition to valsartan, hydrochlorothiazide, and amlodipine.       History of Present Illness: .    History of Present Illness Grace Mccann is a very pleasant 83 year old female who presents for follow-up of CAD.  She experiences joint pain primarily in her knees. Has recently developed severe pain in her right upper arm at night. Admits she sleeps on her right side but pain feels like it is internal and requires topical treatment. She suspects a link to her cholesterol medication, having previously experienced pain with Crestor  and Lipitor. She believes she is taking metoprolol , though it is not listed. Monitors BP consistently at home with occasional low blood pressure readings, sometimes < 100 mmHg, with a general feeling of sleepiness attributed to medications. No presyncope or syncope. She denies chest pain, shortness of breath, edema, orthopnea, PND, palpitations. Ankle swelling occurs intermittently at night. She admits she is not very active, mostly due to knee pain. She does not follow a specific heart-healthy diet.  Her sister was recently diagnosed with atrial fibrillation.    Discussed the use of AI scribe software for clinical note transcription with the patient, who gave verbal consent to proceed.   No results found for: LIPOA   ROS: See HPI       Studies Reviewed: .          Risk Assessment/Calculations:             Physical Exam:   VS: BP 116/68 (BP Location: Left Arm, Patient Position: Sitting, Cuff Size: Large)  Pulse 90   Ht 5' 6 (1.676 m)   Wt 213 lb 4.8 oz (96.8 kg)   SpO2 94%   BMI 34.43 kg/m   Wt Readings from Last 3 Encounters:  11/21/23 213 lb 4.8 oz (96.8 kg)  07/13/23 211 lb 4.8 oz (95.8 kg)  02/10/22 212 lb (96.2 kg)     GEN: Well nourished, well developed in no acute distress NECK: No JVD; No carotid bruits CARDIAC: Irregular RR, no murmurs, rubs, gallops RESPIRATORY:  Clear  to auscultation without rales, wheezing or rhonchi  ABDOMEN: Soft, non-tender, non-distended EXTREMITIES:  No edema; No deformity     ASSESSMENT AND PLAN: .    Assessment & Plan Coronary artery disease CT calcium  score 429 (74th percentile) with focal calcification in left main artery.  Coronary CTA 08/05/2023 revealed severe total plaque volume, mild to moderate CAD with 50 to 69% calcified plaque at proximal LAD with no significant stenosis by CT FFR. She continues to report no chest pain, shortness of breath, or other symptoms concerning for ischemia.  She is not very active and does not follow a heart healthy diet.  No bleeding concerns.  We discussed symptoms for her to report should they develop.  BP is well-controlled. -Ensure compliance with metoprolol  -Continue amlodipine, aspirin , metoprolol , Repatha, valsartan -Trial holding Zetia to see if arm pain improves -Encouraged more heart healthy diet  - Aim for at least 150 minutes of moderate intensity exercise each week -Recommend she consider Silver Sneakers program   Carotid artery stenosis   Carotid duplex 01/2020 with 1 to 39% stenosis bilaterally. She is asymptomatic. No indication for further testing at this time.  -Continue lipid lowering therapy and aspirin   Hyperlipidemia LDL goal < 55/Statin intolerance Lipid panel completed 04/13/2023 with total cholesterol 148, HDL 62, LDL 44, and triglycerides 789.  She has been on Repatha for approximately 1 year and is tolerating well.  Is having upper arm pain and would like to determine if symptoms are exacerbated by ezetimibe. -Recommend trial of holding ezetimibe for 2 weeks -We will call to assess symptom improvement -Continue Repatha 140 mg every 14 days -Encouraged heart healthy, mostly whole food diet avoiding saturated fat, processed food, sugar and other simple carbohydrates and eat more fruit, vegetables and whole grains   Essential hypertension Bilateral ankle edema  RBBB    BP is well-controlled at home.  She is uncertain as to whether she is taking metoprolol .  I have asked her to check at home.  No change in antihypertensive therapy today. Bilateral ankle edema that improves overnight. She has chronic bilateral knee pain secondary to arthritis. TTE 08/08/23 revealed mildly reduced LVEF 50-55%, G2DD, normal RV.  -Low sodium heart healthy diet -Report whether or not taking metoprolol  -If not taking, close monitoring for hypotension once Toprol  is started - Continue to monitor blood pressure regularly -Consider reduction in amlodipine dose if ankle edema persists -Consider reduction in amlodipine dose in setting of adding metoprolol         Disposition: 1 year with me  Signed, Rosaline Bane, NP-C

## 2023-11-29 DIAGNOSIS — M79675 Pain in left toe(s): Secondary | ICD-10-CM | POA: Diagnosis not present

## 2023-11-29 DIAGNOSIS — M79674 Pain in right toe(s): Secondary | ICD-10-CM | POA: Diagnosis not present

## 2023-11-29 DIAGNOSIS — G5790 Unspecified mononeuropathy of unspecified lower limb: Secondary | ICD-10-CM | POA: Diagnosis not present

## 2023-12-07 ENCOUNTER — Encounter (HOSPITAL_BASED_OUTPATIENT_CLINIC_OR_DEPARTMENT_OTHER): Payer: Self-pay | Admitting: *Deleted

## 2023-12-07 ENCOUNTER — Telehealth (HOSPITAL_BASED_OUTPATIENT_CLINIC_OR_DEPARTMENT_OTHER): Payer: Self-pay | Admitting: *Deleted

## 2023-12-07 DIAGNOSIS — I10 Essential (primary) hypertension: Secondary | ICD-10-CM

## 2023-12-07 DIAGNOSIS — E785 Hyperlipidemia, unspecified: Secondary | ICD-10-CM

## 2023-12-07 DIAGNOSIS — Z5181 Encounter for therapeutic drug level monitoring: Secondary | ICD-10-CM

## 2023-12-07 NOTE — Telephone Encounter (Signed)
-----   Message from [facility] G sent at 11/21/2023 11:13 AM EDT ----- Call pt in two weeks to check how arm pain is off the zetia.

## 2023-12-07 NOTE — Telephone Encounter (Signed)
 Lvm to see how pt is with arm pain after pt has benn off zetia.

## 2023-12-08 NOTE — Telephone Encounter (Signed)
 Pt requesting a c/b from the nurse

## 2023-12-08 NOTE — Telephone Encounter (Signed)
 Would recommend she continue Zetia unless she feels symptoms were significantly better while off the Zetia. If she chooses to stay off, recommend repeat lipid in 3 months.

## 2023-12-08 NOTE — Telephone Encounter (Signed)
 Advised patient, verbalized understanding Patient is taking Metoprolol  (was questionable at visit), advised to continue per Rosaline RAMAN NP

## 2023-12-08 NOTE — Telephone Encounter (Signed)
 Spoke with patient, stated arm pain feeling better however is now having neck and shoulder pain  Takes Tylenol arthritis but has been taking so long does not think helps much  Will forward to Rosaline RAMAN NP for review

## 2023-12-21 DIAGNOSIS — M1712 Unilateral primary osteoarthritis, left knee: Secondary | ICD-10-CM | POA: Diagnosis not present

## 2023-12-21 DIAGNOSIS — M25561 Pain in right knee: Secondary | ICD-10-CM | POA: Diagnosis not present

## 2023-12-21 DIAGNOSIS — M170 Bilateral primary osteoarthritis of knee: Secondary | ICD-10-CM | POA: Diagnosis not present

## 2023-12-21 DIAGNOSIS — M25562 Pain in left knee: Secondary | ICD-10-CM | POA: Diagnosis not present

## 2023-12-21 DIAGNOSIS — M1711 Unilateral primary osteoarthritis, right knee: Secondary | ICD-10-CM | POA: Diagnosis not present

## 2024-03-08 LAB — LIPID PANEL
Chol/HDL Ratio: 2.7 ratio (ref 0.0–4.4)
Cholesterol, Total: 145 mg/dL (ref 100–199)
HDL: 54 mg/dL
LDL Chol Calc (NIH): 56 mg/dL (ref 0–99)
Triglycerides: 216 mg/dL — ABNORMAL HIGH (ref 0–149)
VLDL Cholesterol Cal: 35 mg/dL (ref 5–40)

## 2024-03-09 ENCOUNTER — Ambulatory Visit (HOSPITAL_BASED_OUTPATIENT_CLINIC_OR_DEPARTMENT_OTHER): Payer: Self-pay | Admitting: Nurse Practitioner
